# Patient Record
Sex: Female | Born: 1990 | Race: Asian | Hispanic: No | Marital: Married | Smoking: Never smoker
Health system: Southern US, Community
[De-identification: ages and names within clinical notes are randomized; demographics above are authoritative.]

## PROBLEM LIST (undated history)

## (undated) DIAGNOSIS — Z789 Other specified health status: Secondary | ICD-10-CM

---

## 2019-09-08 ENCOUNTER — Other Ambulatory Visit: Payer: Self-pay

## 2019-09-08 ENCOUNTER — Ambulatory Visit (INDEPENDENT_AMBULATORY_CARE_PROVIDER_SITE_OTHER): Payer: Medicaid Other | Admitting: Certified Nurse Midwife

## 2019-09-08 ENCOUNTER — Encounter: Payer: Self-pay | Admitting: Certified Nurse Midwife

## 2019-09-08 VITALS — BP 113/68 | HR 82 | Ht 60.0 in | Wt 133.0 lb

## 2019-09-08 DIAGNOSIS — O34219 Maternal care for unspecified type scar from previous cesarean delivery: Secondary | ICD-10-CM | POA: Diagnosis not present

## 2019-09-08 DIAGNOSIS — Z3A20 20 weeks gestation of pregnancy: Secondary | ICD-10-CM

## 2019-09-08 DIAGNOSIS — Z3480 Encounter for supervision of other normal pregnancy, unspecified trimester: Secondary | ICD-10-CM | POA: Insufficient documentation

## 2019-09-08 DIAGNOSIS — Z862 Personal history of diseases of the blood and blood-forming organs and certain disorders involving the immune mechanism: Secondary | ICD-10-CM | POA: Insufficient documentation

## 2019-09-08 NOTE — Patient Instructions (Signed)
Preparing for Vaginal Birth After Cesarean Delivery Vaginal birth after cesarean delivery (VBAC) is giving birth vaginally after previously delivering a baby through a cesarean section (C-section). You and your health are provider will discuss your options and whether you may be a good candidate for VBAC. What are my options? After a cesarean delivery, your options for future deliveries may include:  Scheduled repeat cesarean delivery. This is done in a hospital with an operating room.  Trial of labor after cesarean (TOLAC). A successful TOLAC results in a vaginal delivery. If it is not successful, you will need to have a cesarean delivery. TOLAC should be attempted in facilities where an emergency cesarean delivery can be performed. It should not be done as a home birth. Talk with your health care provider about the risks and benefits of each option early in your pregnancy. The best option for you will depend on your preferences and your overall health as well as your baby's. What should I know about my past cesarean delivery? It is important to know what type of incision was made in your uterus in a past cesarean delivery. The type of incision can affect the success of your TOLAC. Types of incisions include:  Low transverse. This is a side-to-side cut low on your uterus. The scar on your skin looks like a horizontal line just above your pubic area. This type of cut is the most common and makes you a good candidate for TOLAC.  Low vertical. This is an up-and-down cut low on your uterus. The scar on your skin looks like a vertical line between your pubic area and belly button. This type of cut puts you at higher risk for problems during TOLAC.  High vertical or classical. This is an up-and-down cut high on your uterus. The scar on your skin looks like a vertical line that runs over the top of your belly button. This type of cut has the highest risk for problems and usually means that TOLAC is not an  option. When is VBAC not an option? As you progress through your pregnancy, circumstances may change and you may need to reconsider your options. Your situation may also change even as you begin TOLAC. Your health care provider may not want you to attempt a VBAC if you:  Need to have labor started (induced) because your cervix is not ready for labor.  Have never had a vaginal delivery.  Have had more than two cesarean deliveries.  Are overdue.  Are pregnant with a very large baby.  Have a condition that causes high blood pressure (preeclampsia). Questions to ask your health care provider  Am I a good candidate for TOLAC?  What are my chances of a successful vaginal delivery?  Is my preferred birth location equipped for a TOLAC?  What are my pain management options during a TOLAC? Where to find more information  American Congress of Obstetricians and Gynecologists: www.acog.org  American College of Nurse-Midwives: www.midwife.org Summary  Vaginal birth after cesarean delivery (VBAC) is giving birth vaginally after previously delivering a baby through a cesarean section (C-section).  VBAC may be a safe and appropriate option for you depending on your medical history and other risk factors. Talk with your health care provider about the options available to you, and the risks and benefits of each early in your pregnancy.  TOLAC should be attempted in facilities where emergency cesarean section procedures can be performed. This information is not intended to replace advice given to you by   your health care provider. Make sure you discuss any questions you have with your health care provider. Document Released: 06/19/2011 Document Revised: 01/27/2019 Document Reviewed: 01/10/2017 Elsevier Patient Education  2020 Elsevier Inc.  

## 2019-09-08 NOTE — Progress Notes (Signed)
History:   Ruth Gillespie is a 28 y.o. G3P2002 at [redacted]w[redacted]d by LMP being seen today for her first obstetrical visit with this office. She is a transfer at 20 weeks from [facility].  Her obstetrical history is significant for previous cesarean delivery. Patient does intend to breast feed. Pregnancy history fully reviewed.  Patient reports no complaints. Prenatal records reviewed and scanned under media.      HISTORY: OB History  Gravida Para Term Preterm AB Living  3 2 2  0 0 2  SAB TAB Ectopic Multiple Live Births  0 0 0 0 0    # Outcome Date GA Lbr Len/2nd Weight Sex Delivery Anes PTL Lv  3 Current           2 Term      Vag-Spont     1 Term      CS-LVertical       Patient unsure of last pap smear  - needs PP pap   History reviewed. No pertinent past medical history. Past Surgical History:  Procedure Laterality Date  . CESAREAN SECTION     History reviewed. No pertinent family history. Social History   Tobacco Use  . Smoking status: Never Smoker  . Smokeless tobacco: Never Used  Substance Use Topics  . Alcohol use: Not Currently  . Drug use: Never   Not on File Current Outpatient Medications on File Prior to Visit  Medication Sig Dispense Refill  . ferrous sulfate 325 (65 FE) MG EC tablet Take 325 mg by mouth 3 (three) times daily with meals.    . Prenatal Vit-Fe Fumarate-FA (PRENATAL MULTIVITAMIN) TABS tablet Take 1 tablet by mouth daily at 12 noon.     No current facility-administered medications on file prior to visit.     Review of Systems Pertinent items noted in HPI and remainder of comprehensive ROS otherwise negative. Physical Exam:   Vitals:   09/08/19 1320 09/08/19 1324  BP: 113/68   Pulse: 82   Weight: 133 lb (60.3 kg)   Height:  5' (1.524 m)   Fetal Heart Rate (bpm): 156 System: General: well-developed, well-nourished female in no acute distress   Breasts:  normal appearance, no masses or tenderness bilaterally   Skin: normal coloration and turgor, no rashes   Neurologic: oriented, normal, negative, normal mood   Extremities: normal strength, tone, and muscle mass, ROM of all joints is normal   HEENT PERRLA, extraocular movement intact and sclera clear   Mouth/Teeth mucous membranes moist, pharynx normal without lesions and dental hygiene good   Neck supple and no masses   Cardiovascular: regular rate and rhythm   Respiratory:  no respiratory distress, normal breath sounds   Abdomen: soft, non-tender; bowel sounds normal; no masses,  no organomegaly, fundal height at umbilicus    Assessment:    Pregnancy: 09/10/19 Patient Active Problem List   Diagnosis Date Noted  . Supervision of other normal pregnancy, antepartum 09/08/2019  . History of cesarean delivery, currently pregnant 09/08/2019  . Hx of iron deficiency anemia 09/08/2019     Plan:    1. Supervision of other normal pregnancy, antepartum - patient doing well, no complaints - welcomed to practice and introduced self to patient  - reviewed prenatal records with patient  - Routine prenatal care  - Anticipatory guidance on upcoming appointments  - AFP collected today  - Babyscripts Schedule Optimization - 09/10/2019 MFM OB COMP + 14 WK; Future - AFP only  2. History of cesarean  delivery, currently pregnant - Hx of cesarean delivery d/t breech presentation, successful VBAC with second pregnancy  - Plans VBAC- next appointment with MD to sign consent   3. Hx of iron deficiency anemia - Patient reports hx of IDA in previous pregnancy  - initial Hgb 11.9 on 08/12/19 according to prenatal records  - patient reports taking daily iron supplementation     Continue prenatal vitamins. Genetic Screening discussed, AFP: ordered. Ultrasound discussed; fetal anatomic survey: ordered. Problem list reviewed and updated. The nature of [facility] with multiple MDs and other Advanced Practice Providers was explained  to patient; also emphasized that residents, students are part of our team. Routine obstetric precautions reviewed. Return in about 4 weeks (around 10/06/2019) for ROB- with MD to sign VBAC consent .     Lajean Manes, [place] for [employer], [place]

## 2019-09-10 LAB — AFP, SERUM, OPEN SPINA BIFIDA
AFP MoM: 1.47
AFP Value: 94.1 ng/mL
Gest. Age on Collection Date: 20.3 wk
Maternal Age At EDD: 26.9 a
OSBR Risk 1 IN: 2965
Test Results:: NEGATIVE
Weight: 133 [lb_av]

## 2019-09-23 ENCOUNTER — Ambulatory Visit (HOSPITAL_COMMUNITY)
Admission: RE | Admit: 2019-09-23 | Discharge: 2019-09-23 | Disposition: A | Discharge status: Home / Self Care | Payer: Medicaid Other | Source: Ambulatory Visit | Attending: Obstetrics and Gynecology | Admitting: Obstetrics and Gynecology

## 2019-09-23 ENCOUNTER — Other Ambulatory Visit (HOSPITAL_COMMUNITY): Payer: Self-pay | Admitting: *Deleted

## 2019-09-23 ENCOUNTER — Other Ambulatory Visit: Payer: Self-pay

## 2019-09-23 DIAGNOSIS — Z362 Encounter for other antenatal screening follow-up: Secondary | ICD-10-CM

## 2019-09-23 DIAGNOSIS — Z3480 Encounter for supervision of other normal pregnancy, unspecified trimester: Secondary | ICD-10-CM | POA: Insufficient documentation

## 2019-09-23 DIAGNOSIS — Z363 Encounter for antenatal screening for malformations: Secondary | ICD-10-CM | POA: Diagnosis not present

## 2019-09-23 DIAGNOSIS — O34219 Maternal care for unspecified type scar from previous cesarean delivery: Secondary | ICD-10-CM | POA: Diagnosis not present

## 2019-09-23 DIAGNOSIS — Z3A22 22 weeks gestation of pregnancy: Secondary | ICD-10-CM | POA: Diagnosis not present

## 2019-10-05 ENCOUNTER — Ambulatory Visit (INDEPENDENT_AMBULATORY_CARE_PROVIDER_SITE_OTHER): Payer: Medicaid Other | Admitting: Obstetrics & Gynecology

## 2019-10-05 ENCOUNTER — Other Ambulatory Visit: Payer: Self-pay

## 2019-10-05 VITALS — BP 115/65 | HR 96 | Temp 98.2°F | Wt 139.0 lb

## 2019-10-05 DIAGNOSIS — Z862 Personal history of diseases of the blood and blood-forming organs and certain disorders involving the immune mechanism: Secondary | ICD-10-CM

## 2019-10-05 DIAGNOSIS — Z3A24 24 weeks gestation of pregnancy: Secondary | ICD-10-CM

## 2019-10-05 DIAGNOSIS — O34219 Maternal care for unspecified type scar from previous cesarean delivery: Secondary | ICD-10-CM

## 2019-10-05 DIAGNOSIS — Z3480 Encounter for supervision of other normal pregnancy, unspecified trimester: Secondary | ICD-10-CM

## 2019-10-05 NOTE — Progress Notes (Signed)
   PRENATAL VISIT NOTE  Subjective:  Ruth Gillespie is a 28 y.o. G3P2002 at [redacted]w[redacted]d being seen today for ongoing prenatal care.  She is currently monitored for the following issues for this low-risk pregnancy and has Supervision of other normal pregnancy, antepartum; History of cesarean delivery, currently pregnant; and Hx of iron deficiency anemia on their problem list.  Patient reports pelvic pressure in the morning in gunial region, no pubic symphysis pain.  Contractions: Not present. Vag. Bleeding: None.  Movement: Present. Denies leaking of fluid.   The following portions of the patient's history were reviewed and updated as appropriate: allergies, current medications, past family history, past medical history, past social history, past surgical history and problem list.   Objective:   Vitals:   10/05/19 1342  BP: 115/65  Pulse: 96  Temp: 98.2 F (36.8 C)  Weight: 139 lb (63 kg)    Fetal Status: Fetal Heart Rate (bpm): 145   Movement: Present     General:  Alert, oriented and cooperative. Patient is in no acute distress.  Skin: Skin is warm and dry. No rash noted.   Cardiovascular: Normal heart rate noted  Respiratory: Normal respiratory effort, no problems with respiration noted  Abdomen: Soft, gravid, appropriate for gestational age.  Pain/Pressure: Present     Pelvic: Cervical exam performed      internal os closed, uterus low in pelvis, lax abdominal wall and mobile uterus.  Extremities: Normal range of motion.  Edema: None  Mental Status: Normal mood and affect. Normal behavior. Normal judgment and thought content.   Assessment and Plan:  Pregnancy: G3P2002 at [redacted]w[redacted]d 1. Hx of iron deficiency anemia Continue Iron  2. History of cesarean delivery, currently pregnant VBAC consent signed.  Pelvis proven to 8 pounds 3 ounces  Preterm labor symptoms and general obstetric precautions including but not limited to vaginal bleeding, contractions, leaking of fluid and  fetal movement were reviewed in detail with the patient. Please refer to After Visit Summary for other counseling recommendations.   Return in about 4 weeks (around 11/02/2019) for Routine OB.  Future Appointments  Date Time Provider [place]  10/22/2019  3:00 PM [facility] [place] 5 [facility]    Silas [place], MD

## 2019-10-16 NOTE — L&D Delivery Note (Signed)
Delivery Note At 8:09 AM a viable female was delivered via Vaginal, Spontaneous (Presentation: Right Occiput Anterior).  APGAR: 9, 10; weight  .   Placenta status: Spontaneous;Expressed, Intact.  Cord: 3 vessels with the following complications: None.    Anesthesia: Epidural Episiotomy: None Lacerations: 1st degree Suture Repair: 3.0 vicryl Est. Blood Loss (mL): 300  Mom to postpartum.  Baby to Couplet care / Skin to Skin.  Allie Bossier 01/12/2020, 8:37 AM

## 2019-10-22 ENCOUNTER — Other Ambulatory Visit: Payer: Self-pay

## 2019-10-22 ENCOUNTER — Ambulatory Visit (HOSPITAL_COMMUNITY)
Admission: RE | Admit: 2019-10-22 | Discharge: 2019-10-22 | Disposition: A | Discharge status: Home / Self Care | Payer: Medicaid Other | Source: Ambulatory Visit | Attending: Obstetrics and Gynecology | Admitting: Obstetrics and Gynecology

## 2019-10-22 DIAGNOSIS — Z3A26 26 weeks gestation of pregnancy: Secondary | ICD-10-CM | POA: Diagnosis not present

## 2019-10-22 DIAGNOSIS — O34219 Maternal care for unspecified type scar from previous cesarean delivery: Secondary | ICD-10-CM

## 2019-10-22 DIAGNOSIS — Z362 Encounter for other antenatal screening follow-up: Secondary | ICD-10-CM | POA: Diagnosis present

## 2019-10-22 DIAGNOSIS — O99012 Anemia complicating pregnancy, second trimester: Secondary | ICD-10-CM | POA: Diagnosis not present

## 2019-11-02 ENCOUNTER — Other Ambulatory Visit: Payer: Self-pay

## 2019-11-02 ENCOUNTER — Ambulatory Visit (INDEPENDENT_AMBULATORY_CARE_PROVIDER_SITE_OTHER): Payer: Medicaid Other | Admitting: Obstetrics & Gynecology

## 2019-11-02 VITALS — BP 99/55 | HR 72 | Temp 98.0°F | Wt 139.0 lb

## 2019-11-02 DIAGNOSIS — Z3480 Encounter for supervision of other normal pregnancy, unspecified trimester: Secondary | ICD-10-CM

## 2019-11-02 DIAGNOSIS — Z3483 Encounter for supervision of other normal pregnancy, third trimester: Secondary | ICD-10-CM

## 2019-11-02 DIAGNOSIS — Z23 Encounter for immunization: Secondary | ICD-10-CM

## 2019-11-02 DIAGNOSIS — Z3A28 28 weeks gestation of pregnancy: Secondary | ICD-10-CM

## 2019-11-02 NOTE — Progress Notes (Signed)
   PRENATAL VISIT NOTE  Subjective:  Ruth Gillespie is a 29 y.o. G3P2002 at [redacted]w[redacted]d being seen today for ongoing prenatal care.  She is currently monitored for the following issues for this low-risk pregnancy and has Supervision of other normal pregnancy, antepartum; History of cesarean delivery, currently pregnant; and Hx of iron deficiency anemia on their problem list.  Patient reports no complaints.  Contractions: Not present. Vag. Bleeding: None.  Movement: Present. Denies leaking of fluid.   The following portions of the patient's history were reviewed and updated as appropriate: allergies, current medications, past family history, past medical history, past social history, past surgical history and problem list.   Objective:   Vitals:   11/02/19 0803  BP: (!) 99/55  Pulse: 72  Temp: 98 F (36.7 C)  Weight: 139 lb (63 kg)    Fetal Status: Fetal Heart Rate (bpm): 143   Movement: Present     General:  Alert, oriented and cooperative. Patient is in no acute distress.  Skin: Skin is warm and dry. No rash noted.   Cardiovascular: Normal heart rate noted  Respiratory: Normal respiratory effort, no problems with respiration noted  Abdomen: Soft, gravid, appropriate for gestational age.  Pain/Pressure: Absent     Pelvic: Cervical exam deferred        Extremities: Normal range of motion.  Edema: None  Mental Status: Normal mood and affect. Normal behavior. Normal judgment and thought content.   Assessment and Plan:  Pregnancy: G3P2002 at [redacted]w[redacted]d 1. Supervision of other normal pregnancy, antepartum - 2 Hour GTT - CBC - HIV antibody (with reflex) - RPR - Tdap vaccine greater than or equal to 7yo IM  Preterm labor symptoms and general obstetric precautions including but not limited to vaginal bleeding, contractions, leaking of fluid and fetal movement were reviewed in detail with the patient. Please refer to After Visit Summary for other counseling recommendations.   No  follow-ups on file.  Future Appointments  Date Time Provider Department Center  11/30/2019  1:15 PM Conan Bowens, MD [facility]    Elsie Lincoln, MD

## 2019-11-03 LAB — 2HR GTT W 1 HR, CARPENTER, 75 G
Glucose, 1 Hr, Gest: 121 mg/dL (ref 65–179)
Glucose, 2 Hr, Gest: 69 mg/dL (ref 65–152)
Glucose, Fasting, Gest: 71 mg/dL (ref 65–91)

## 2019-11-03 LAB — CBC
HCT: 32.1 % — ABNORMAL LOW (ref 35.0–45.0)
Hemoglobin: 10.9 g/dL — ABNORMAL LOW (ref 11.7–15.5)
MCH: 29.9 pg (ref 27.0–33.0)
MCHC: 34 g/dL (ref 32.0–36.0)
MCV: 87.9 fL (ref 80.0–100.0)
MPV: 9.7 fL (ref 7.5–12.5)
Platelets: 249 10*3/uL (ref 140–400)
RBC: 3.65 10*6/uL — ABNORMAL LOW (ref 3.80–5.10)
RDW: 12 % (ref 11.0–15.0)
WBC: 9.9 10*3/uL (ref 3.8–10.8)

## 2019-11-03 LAB — HIV ANTIBODY (ROUTINE TESTING W REFLEX): HIV 1&2 Ab, 4th Generation: NONREACTIVE

## 2019-11-03 LAB — RPR: RPR Ser Ql: NONREACTIVE

## 2019-11-30 ENCOUNTER — Telehealth (INDEPENDENT_AMBULATORY_CARE_PROVIDER_SITE_OTHER): Payer: Medicaid Other | Admitting: Obstetrics & Gynecology

## 2019-11-30 ENCOUNTER — Encounter: Payer: Self-pay | Admitting: Obstetrics & Gynecology

## 2019-11-30 DIAGNOSIS — Z862 Personal history of diseases of the blood and blood-forming organs and certain disorders involving the immune mechanism: Secondary | ICD-10-CM

## 2019-11-30 DIAGNOSIS — Z3A32 32 weeks gestation of pregnancy: Secondary | ICD-10-CM

## 2019-11-30 DIAGNOSIS — O34219 Maternal care for unspecified type scar from previous cesarean delivery: Secondary | ICD-10-CM

## 2019-11-30 NOTE — Progress Notes (Signed)
Difficulty sleeping

## 2019-11-30 NOTE — Progress Notes (Signed)
Patient ID: Ruth Gillespie, female   DOB: 1991/04/17, 29 y.o.   MRN: [id number]  I connected with@ on 11/30/19 at  1:15 PM EST by: my chart and verified that I am speaking with the correct person using two identifiers.  Patient is located at home and provider is located at [place] office.     The purpose of this virtual visit is to provide medical care while limiting exposure to the novel coronavirus. I discussed the limitations, risks, security and privacy concerns of performing an evaluation and management service by mychart and the availability of in person appointments. I also discussed with the patient that there may be a patient responsible charge related to this service. By engaging in this virtual visit, you consent to the provision of healthcare.  Additionally, you authorize for your insurance to be billed for the services provided during this visit.  The patient expressed understanding and agreed to proceed.  The following staff members participated in the virtual visit:  Mariel Aloe, RN    PRENATAL VISIT NOTE  Subjective:  Ruth Gillespie is a 29 y.o. G3P2002 at [redacted]w[redacted]d  for phone visit for ongoing prenatal care.  She is currently monitored for the following issues for this low-risk pregnancy and has Supervision of other normal pregnancy, antepartum; History of cesarean delivery, currently pregnant; and Hx of iron deficiency anemia on their problem list.  Patient reports back pain and leg numbness when sleeping on side.  Contractions: Irritability. Vag. Bleeding: None.  Movement: Present. Denies leaking of fluid.   The following portions of the patient's history were reviewed and updated as appropriate: allergies, current medications, past family history, past medical history, past social history, past surgical history and problem list.   Objective:   Vitals:   11/30/19 1302  BP: (!) 103/57  Pulse: 86  Weight: 141 lb (64 kg)   Self-Obtained  Fetal Status:      Movement: Present     Assessment and Plan:  Pregnancy: G3P2002 at [redacted]w[redacted]d 1. History of cesarean delivery, currently pregnant Plans for VBAC Considering nexplanon or IUD Body pillow for between legs or to tilt at 15 degree angle vs laying completely on side.    2. Hx of iron deficiency anemia Continue iron and check CBC at 36 weeks  Preterm labor symptoms and general obstetric precautions including but not limited to vaginal bleeding, contractions, leaking of fluid and fetal movement were reviewed in detail with the patient.  RTC 4 weeks in person  Time spent on virtual visit: 10 minutes  Elsie Lincoln, MD

## 2019-11-30 NOTE — Patient Instructions (Addendum)
Intrauterine Device Insertion An intrauterine device (IUD) is a medical device that gets inserted into the uterus to prevent pregnancy. It is a small, T-shaped device that has one or two nylon strings hanging down from it. The strings hang out of the lower part of the uterus (cervix) to allow for future IUD removal. There are two types of IUDs available:  Copper IUD. This type of IUD has copper wire wrapped around it. Copper makes the uterus and fallopian tubes produce a fluid that kills sperm. A copper IUD may last up to 10 years.  Hormone IUD. This type of IUD is made of plastic and contains the hormone progestin (synthetic progesterone). The hormone thickens mucus in the cervix and prevents sperm from entering the uterus. It also thins the uterine lining to prevent implantation of a fertilized egg. The hormone can weaken or kill the sperm that get into the uterus. A hormone IUD may last 3-5 years. Tell a health care provider about:  Any allergies you have.  All medicines you are taking, including vitamins, herbs, eye drops, creams, and over-the-counter medicines.  Any problems you or family members have had with anesthetic medicines.  Any blood disorders you have.  Any surgeries you have had.  Any medical conditions you have, including any STIs (sexually transmitted infections) you may have.  Whether you are pregnant or may be pregnant. What are the risks? Generally, this is a safe procedure. However, problems may occur, including:  Infection.  Bleeding.  Allergic reactions to medicines.  Accidental puncture (perforation) of the uterus, or damage to other structures or organs.  Accidental placement of the IUD either in the muscle layer of the uterus (myometrium) or outside the uterus.  The IUD falling out of the uterus (expulsion). This is more common among women who have recently had a child.  Pregnancy that happens in the fallopian tube (ectopic pregnancy).  Infection of  the uterus and fallopian tubes (pelvic inflammatory disease). What happens before the procedure?  Schedule the IUD insertion for when you will have your menstrual period or right after, to make sure you are not pregnant. Placement of the IUD is better tolerated shortly after a menstrual cycle.  Follow instructions from your health care provider about eating or drinking restrictions.  Ask your health care provider about changing or stopping your regular medicines. This is especially important if you are taking diabetes medicines or blood thinners.  You may get a pain reliever to take before the procedure.  You may have tests for: ? Pregnancy. A pregnancy test involves having a urine sample taken. ? STIs. Placing an IUD in someone who has an STI can make the infection worse. ? Cervical cancer. You may have a Pap test to check for this type of cancer. This means collecting cells from your cervix to be examined under a microscope.  You may have a physical exam to determine the size and position of your uterus. The procedure may vary among health care providers and hospitals. What happens during the procedure?  A tool (speculum) will be placed in your vagina and widened so that your health care provider can see your cervix.  Medicine may be applied to your cervix to help lower your risk of infection (antiseptic medicine).  You may be given an anesthetic medicine to numb each side of your cervix (intracervical block or paracervical block). This medicine is usually given by an injection into the cervix.  A tool (uterine sound) will be inserted into your   uterus to determine the length of your uterus and the direction that your uterus may be tilted.  A slim instrument or tube (IUD inserter) that holds the IUD will be inserted into your vagina, through your cervical canal, and into your uterus.  The IUD will be placed in the uterus, and the IUD inserter will be removed.  The strings that are  attached to the IUD will be trimmed so that they lie just below the cervix. The procedure may vary among health care providers and hospitals. What happens after the procedure?  You may have bleeding after the procedure. This is normal. It varies from light bleeding (spotting) for a few days to menstrual-like bleeding.  You may have cramping and pain.  You may feel dizzy or light-headed.  You may have lower back pain. Summary  An intrauterine device (IUD) is a small, T-shaped device that has one or two nylon strings hanging down from it.  Two types of IUDs are available. You may have a copper IUD or a hormone IUD.  Schedule the IUD insertion for when you will have your menstrual period or right after, to make sure you are not pregnant. Placement of the IUD is better tolerated shortly after a menstrual cycle.  You may have bleeding after the procedure. This is normal. It varies from light spotting for a few days to menstrual-like bleeding. This information is not intended to replace advice given to you by your health care provider. Make sure you discuss any questions you have with your health care provider. Document Revised: 09/13/2017 Document Reviewed: 08/22/2016 Elsevier Patient Education  2020 [place] implant What is this medicine? ETONOGESTREL (et oh noe JES trel) is a contraceptive (birth control) device. It is used to prevent pregnancy. It can be used for up to 3 years. This medicine may be used for other purposes; ask your health care provider or pharmacist if you have questions. COMMON BRAND NAME(S): Implanon, Nexplanon What should I tell my health care provider before I take this medicine? They need to know if you have any of these conditions:  abnormal vaginal bleeding  blood vessel disease or blood clots  breast, cervical, endometrial, ovarian, liver, or uterine cancer  diabetes  gallbladder disease  heart disease or recent heart attack  high  blood pressure  high cholesterol or triglycerides  kidney disease  liver disease  migraine headaches  seizures  stroke  tobacco smoker  an unusual or allergic reaction to etonogestrel, anesthetics or antiseptics, other medicines, foods, dyes, or preservatives  pregnant or trying to get pregnant  breast-feeding How should I use this medicine? This device is inserted just under the skin on the inner side of your upper arm by a health care professional. Talk to your pediatrician regarding the use of this medicine in children. Special care may be needed. Overdosage: If you think you have taken too much of this medicine contact a poison control center or emergency room at once. NOTE: This medicine is only for you. Do not share this medicine with others. What if I miss a dose? This does not apply. What may interact with this medicine? Do not take this medicine with any of the following medications:  amprenavir  fosamprenavir This medicine may also interact with the following medications:  acitretin  aprepitant  armodafinil  bexarotene  bosentan  carbamazepine  certain medicines for fungal infections like fluconazole, ketoconazole, itraconazole and voriconazole  certain medicines to treat hepatitis, HIV or AIDS  cyclosporine  felbamate  griseofulvin  lamotrigine  modafinil  oxcarbazepine  phenobarbital  phenytoin  primidone  rifabutin  rifampin  rifapentine  St. John's wort  topiramate This list may not describe all possible interactions. Give your health care provider a list of all the medicines, herbs, non-prescription drugs, or dietary supplements you use. Also tell them if you smoke, drink alcohol, or use illegal drugs. Some items may interact with your medicine. What should I watch for while using this medicine? This product does not protect you against HIV infection (AIDS) or other sexually transmitted diseases. You should be able to  feel the implant by pressing your fingertips over the skin where it was inserted. Contact your doctor if you cannot feel the implant, and use a non-hormonal birth control method (such as condoms) until your doctor confirms that the implant is in place. Contact your doctor if you think that the implant may have broken or become bent while in your arm. You will receive a user card from your health care provider after the implant is inserted. The card is a record of the location of the implant in your upper arm and when it should be removed. Keep this card with your health records. What side effects may I notice from receiving this medicine? Side effects that you should report to your doctor or health care professional as soon as possible:  allergic reactions like skin rash, itching or hives, swelling of the face, lips, or tongue  breast lumps, breast tissue changes, or discharge  breathing problems  changes in emotions or moods  coughing up blood  if you feel that the implant may have broken or bent while in your arm  high blood pressure  pain, irritation, swelling, or bruising at the insertion site  scar at site of insertion  signs of infection at the insertion site such as fever, and skin redness, pain or discharge  signs and symptoms of a blood clot such as breathing problems; changes in vision; chest pain; severe, sudden headache; pain, swelling, warmth in the leg; trouble speaking; sudden numbness or weakness of the face, arm or leg  signs and symptoms of liver injury like dark yellow or brown urine; general ill feeling or flu-like symptoms; light-colored stools; loss of appetite; nausea; right upper belly pain; unusually weak or tired; yellowing of the eyes or skin  unusual vaginal bleeding, discharge Side effects that usually do not require medical attention (report to your doctor or health care professional if they continue or are bothersome):  acne  breast pain or tenderness   headache  irregular menstrual bleeding  nausea This list may not describe all possible side effects. Call your doctor for medical advice about side effects. You may report side effects to FDA at 1-800-FDA-1088. Where should I keep my medicine? This drug is given in a hospital or clinic and will not be stored at home. NOTE: This sheet is a summary. It may not cover all possible information. If you have questions about this medicine, talk to your doctor, pharmacist, or health care provider.  2020 Elsevier/Gold Standard (2019-07-14 11:33:04)

## 2019-12-31 ENCOUNTER — Other Ambulatory Visit (HOSPITAL_COMMUNITY)
Admission: RE | Admit: 2019-12-31 | Discharge: 2019-12-31 | Disposition: A | Discharge status: Home / Self Care | Payer: Medicaid Other | Source: Ambulatory Visit | Attending: Obstetrics & Gynecology | Admitting: Obstetrics & Gynecology

## 2019-12-31 ENCOUNTER — Encounter: Payer: Self-pay | Admitting: Obstetrics & Gynecology

## 2019-12-31 ENCOUNTER — Other Ambulatory Visit: Payer: Self-pay

## 2019-12-31 ENCOUNTER — Ambulatory Visit (INDEPENDENT_AMBULATORY_CARE_PROVIDER_SITE_OTHER): Payer: Medicaid Other | Admitting: Obstetrics & Gynecology

## 2019-12-31 VITALS — BP 118/69 | HR 93 | Temp 97.9°F | Wt 148.0 lb

## 2019-12-31 DIAGNOSIS — Z3483 Encounter for supervision of other normal pregnancy, third trimester: Secondary | ICD-10-CM

## 2019-12-31 DIAGNOSIS — Z862 Personal history of diseases of the blood and blood-forming organs and certain disorders involving the immune mechanism: Secondary | ICD-10-CM | POA: Diagnosis not present

## 2019-12-31 DIAGNOSIS — O26843 Uterine size-date discrepancy, third trimester: Secondary | ICD-10-CM

## 2019-12-31 DIAGNOSIS — Z3480 Encounter for supervision of other normal pregnancy, unspecified trimester: Secondary | ICD-10-CM

## 2019-12-31 DIAGNOSIS — O34219 Maternal care for unspecified type scar from previous cesarean delivery: Secondary | ICD-10-CM

## 2019-12-31 DIAGNOSIS — Z3A36 36 weeks gestation of pregnancy: Secondary | ICD-10-CM

## 2019-12-31 LAB — OB RESULTS CONSOLE GBS: GBS: POSITIVE

## 2019-12-31 NOTE — Progress Notes (Signed)
   PRENATAL VISIT NOTE  Subjective:  Ruth Gillespie is a 29 y.o. G3P2002 at [redacted]w[redacted]d being seen today for ongoing prenatal care.  She is currently monitored for the following issues for this low-risk pregnancy and has Supervision of other normal pregnancy, antepartum; History of cesarean delivery, currently pregnant; Hx of iron deficiency anemia; and History of successful VBAC on their problem list.  Patient reports occasional contractions.  Contractions: Irritability. Vag. Bleeding: None.  Movement: Present. Denies leaking of fluid.   The following portions of the patient's history were reviewed and updated as appropriate: allergies, current medications, past family history, past medical history, past social history, past surgical history and problem list.   Objective:   Vitals:   12/31/19 1432  BP: 118/69  Pulse: 93  Temp: 97.9 F (36.6 C)  Weight: 148 lb (67.1 kg)    Fetal Status:     Movement: Present  Presentation: Vertex  General:  Alert, oriented and cooperative. Patient is in no acute distress.  Skin: Skin is warm and dry. No rash noted.   Cardiovascular: Normal heart rate noted  Respiratory: Normal respiratory effort, no problems with respiration noted  Abdomen: Soft, gravid, appropriate for gestational age.  Pain/Pressure: Absent     Pelvic: Cervical exam performed in the presence of a chaperone Dilation: Fingertip Effacement (%): 50 Station: -3  Extremities: Normal range of motion.  Edema: Trace  Mental Status: Normal mood and affect. Normal behavior. Normal judgment and thought content.   Assessment and Plan:  Pregnancy: G3P2002 at [redacted]w[redacted]d 1. Hx of iron deficiency anemia - CBC will check in place CBC Latest Ref Rng & Units 11/02/2019  WBC 3.8 - 10.8 Thousand/uL 9.9  Hemoglobin 11.7 - 15.5 g/dL 10.9(L)  Hematocrit 35.0 - 45.0 % 32.1(L)  Platelets 140 - 400 Thousand/uL 249   2. History of cesarean delivery, currently pregnant 3. History of successful VBAC  Desires TOLAC, consent signed on 10/05/2019  4. Concern that size of fetus is inconsistent with dates in third trimester Patient is concerned this baby is bigger than her previous two babies, had 8lb 15ozx infant via C/S and 8 lb infant via VBAC.  Growth scan ordered.  If baby is on the larger size, she is interested in IOL at 39 weeks.  - [place] MFM OB FOLLOW UP; Future  5. Supervision of other normal pregnancy, antepartum Pelvic cultures done.   - Culture, beta strep (group b only) - Cervicovaginal ancillary only( [facility]) Considering BTS, Medicaid papers were signed. Also considering IPP IUD. Preterm labor symptoms and general obstetric precautions including but not limited to vaginal bleeding, contractions, leaking of fluid and fetal movement were reviewed in detail with the patient. Please refer to After Visit Summary for other counseling recommendations.   Return in about 1 week (around 01/07/2020) for OFFICE OB Visit.  No future appointments.  Jaynie Collins, MD

## 2019-12-31 NOTE — Patient Instructions (Signed)
Return to office for any scheduled appointments. Call the office or go to the MAU at [facility] [place] if:  You begin to have strong, frequent contractions  Your water breaks.  Sometimes it is a big gush of fluid, sometimes it is just a trickle that keeps getting your panties wet or running down your legs  You have vaginal bleeding.  It is normal to have a small amount of spotting if your cervix was checked.   You do not feel your baby moving like normal.  If you do not, get something to eat and drink and lay down and focus on feeling your baby move.   If your baby is still not moving like normal, you should call the office or go to MAU.  Any other obstetric concerns.   

## 2020-01-01 ENCOUNTER — Ambulatory Visit (HOSPITAL_COMMUNITY)
Admission: RE | Admit: 2020-01-01 | Discharge: 2020-01-01 | Disposition: A | Discharge status: Home / Self Care | Payer: Medicaid Other | Source: Ambulatory Visit | Attending: Obstetrics and Gynecology | Admitting: Obstetrics and Gynecology

## 2020-01-01 DIAGNOSIS — O34219 Maternal care for unspecified type scar from previous cesarean delivery: Secondary | ICD-10-CM | POA: Diagnosis not present

## 2020-01-01 DIAGNOSIS — O99013 Anemia complicating pregnancy, third trimester: Secondary | ICD-10-CM | POA: Diagnosis not present

## 2020-01-01 DIAGNOSIS — O26843 Uterine size-date discrepancy, third trimester: Secondary | ICD-10-CM | POA: Diagnosis present

## 2020-01-01 DIAGNOSIS — Z362 Encounter for other antenatal screening follow-up: Secondary | ICD-10-CM

## 2020-01-01 DIAGNOSIS — Z3A36 36 weeks gestation of pregnancy: Secondary | ICD-10-CM

## 2020-01-01 DIAGNOSIS — D649 Anemia, unspecified: Secondary | ICD-10-CM

## 2020-01-01 LAB — CERVICOVAGINAL ANCILLARY ONLY
Chlamydia: NEGATIVE
Comment: NEGATIVE
Comment: NORMAL
Neisseria Gonorrhea: NEGATIVE

## 2020-01-02 ENCOUNTER — Encounter: Payer: Self-pay | Admitting: Obstetrics & Gynecology

## 2020-01-02 DIAGNOSIS — O99820 Streptococcus B carrier state complicating pregnancy: Secondary | ICD-10-CM | POA: Insufficient documentation

## 2020-01-02 LAB — CBC
HCT: 35.4 % (ref 35.0–45.0)
Hemoglobin: 12.5 g/dL (ref 11.7–15.5)
MCH: 30 pg (ref 27.0–33.0)
MCHC: 35.3 g/dL (ref 32.0–36.0)
MCV: 85.1 fL (ref 80.0–100.0)
MPV: 10.3 fL (ref 7.5–12.5)
Platelets: 241 10*3/uL (ref 140–400)
RBC: 4.16 10*6/uL (ref 3.80–5.10)
RDW: 13.4 % (ref 11.0–15.0)
WBC: 10.4 10*3/uL (ref 3.8–10.8)

## 2020-01-02 LAB — CULTURE, BETA STREP (GROUP B ONLY)
MICRO NUMBER:: 10266651
SPECIMEN QUALITY:: ADEQUATE

## 2020-01-11 ENCOUNTER — Ambulatory Visit (INDEPENDENT_AMBULATORY_CARE_PROVIDER_SITE_OTHER): Payer: Medicaid Other | Admitting: Obstetrics & Gynecology

## 2020-01-11 ENCOUNTER — Encounter (HOSPITAL_COMMUNITY): Payer: Self-pay | Admitting: Obstetrics & Gynecology

## 2020-01-11 ENCOUNTER — Inpatient Hospital Stay (HOSPITAL_COMMUNITY)
Admission: AD | Admit: 2020-01-11 | Discharge: 2020-01-13 | DRG: 807 | Disposition: A | Discharge status: Home / Self Care | Payer: Medicaid Other | Attending: Obstetrics & Gynecology | Admitting: Obstetrics & Gynecology

## 2020-01-11 ENCOUNTER — Other Ambulatory Visit: Payer: Self-pay

## 2020-01-11 VITALS — BP 117/73 | HR 93 | Temp 98.5°F | Wt 149.0 lb

## 2020-01-11 DIAGNOSIS — Z862 Personal history of diseases of the blood and blood-forming organs and certain disorders involving the immune mechanism: Secondary | ICD-10-CM

## 2020-01-11 DIAGNOSIS — O99824 Streptococcus B carrier state complicating childbirth: Secondary | ICD-10-CM | POA: Diagnosis present

## 2020-01-11 DIAGNOSIS — Z20822 Contact with and (suspected) exposure to covid-19: Secondary | ICD-10-CM | POA: Diagnosis present

## 2020-01-11 DIAGNOSIS — O34211 Maternal care for low transverse scar from previous cesarean delivery: Secondary | ICD-10-CM | POA: Diagnosis not present

## 2020-01-11 DIAGNOSIS — O34219 Maternal care for unspecified type scar from previous cesarean delivery: Secondary | ICD-10-CM

## 2020-01-11 DIAGNOSIS — N858 Other specified noninflammatory disorders of uterus: Secondary | ICD-10-CM | POA: Diagnosis not present

## 2020-01-11 DIAGNOSIS — Z3A38 38 weeks gestation of pregnancy: Secondary | ICD-10-CM | POA: Diagnosis not present

## 2020-01-11 DIAGNOSIS — Z3480 Encounter for supervision of other normal pregnancy, unspecified trimester: Secondary | ICD-10-CM

## 2020-01-11 DIAGNOSIS — O99820 Streptococcus B carrier state complicating pregnancy: Secondary | ICD-10-CM

## 2020-01-11 DIAGNOSIS — O26893 Other specified pregnancy related conditions, third trimester: Secondary | ICD-10-CM | POA: Diagnosis present

## 2020-01-11 HISTORY — DX: Other specified health status: Z78.9

## 2020-01-11 LAB — CBC
HCT: 37.1 % (ref 36.0–46.0)
Hemoglobin: 12.4 g/dL (ref 12.0–15.0)
MCH: 29.3 pg (ref 26.0–34.0)
MCHC: 33.4 g/dL (ref 30.0–36.0)
MCV: 87.7 fL (ref 80.0–100.0)
Platelets: 254 10*3/uL (ref 150–400)
RBC: 4.23 MIL/uL (ref 3.87–5.11)
RDW: 13.6 % (ref 11.5–15.5)
WBC: 14.2 10*3/uL — ABNORMAL HIGH (ref 4.0–10.5)
nRBC: 0 % (ref 0.0–0.2)

## 2020-01-11 MED ORDER — SODIUM CHLORIDE 0.9 % IV SOLN
2.0000 g | Freq: Once | INTRAVENOUS | Status: AC
  Administered 2020-01-11: 2 g via INTRAVENOUS
  Filled 2020-01-11: qty 2000

## 2020-01-11 MED ORDER — LACTATED RINGERS IV SOLN: INTRAVENOUS | Status: DC

## 2020-01-11 NOTE — Progress Notes (Signed)
   PRENATAL VISIT NOTE  Subjective:  Ruth Gillespie is a 29 y.o. G3P2002 at [redacted]w[redacted]d being seen today for ongoing prenatal care.  She is currently monitored for the following issues for this low-risk pregnancy and has Supervision of other normal pregnancy, antepartum; History of cesarean delivery, currently pregnant; Hx of iron deficiency anemia; History of successful VBAC; and Group B Streptococcus carrier, +RV culture, currently pregnant on their problem list.  Patient reports no complaints.  Contractions: Irregular. Vag. Bleeding: None.  Movement: Present. Denies leaking of fluid.   The following portions of the patient's history were reviewed and updated as appropriate: allergies, current medications, past family history, past medical history, past social history, past surgical history and problem list.   Objective:   Vitals:   01/11/20 1443  BP: 117/73  Pulse: 93  Temp: 98.5 F (36.9 C)  Weight: 149 lb (67.6 kg)    Fetal Status:     Movement: Present     General:  Alert, oriented and cooperative. Patient is in no acute distress.  Skin: Skin is warm and dry. No rash noted.   Cardiovascular: Normal heart rate noted  Respiratory: Normal respiratory effort, no problems with respiration noted  Abdomen: Soft, gravid, appropriate for gestational age.  Pain/Pressure: Present     Pelvic: Cervical exam performed in the presence of a chaperone        Extremities: Normal range of motion.  Edema: Trace  Mental Status: Normal mood and affect. Normal behavior. Normal judgment and thought content.   Assessment and Plan:  Pregnancy: G3P2002 at [redacted]w[redacted]d 1. History of successful VBAC - plans for another TOLAC  2. Supervision of other normal pregnancy, antepartum   3. Group B Streptococcus carrier, +RV culture, currently pregnant - treat in labor  Term labor symptoms and general obstetric precautions including but not limited to vaginal bleeding, contractions, leaking of fluid and  fetal movement were reviewed in detail with the patient. Please refer to After Visit Summary for other counseling recommendations.   No follow-ups on file.  Future Appointments  Date Time Provider Department Center  01/11/2020  3:00 PM Allie Bossier, MD [facility]    Allie Bossier, MD

## 2020-01-11 NOTE — Progress Notes (Signed)
Dr, Marice Potter called and requested pt be admited

## 2020-01-11 NOTE — MAU Note (Signed)
Pt reports contractions that started around 5pm, worsened around 8pm. Now every 4-5 minutes and more intense. Reports she was 5cm in the office today. Denies LOF. Reports some bloody show earlier. Reports good fetal movement.

## 2020-01-12 ENCOUNTER — Inpatient Hospital Stay (HOSPITAL_COMMUNITY): Payer: Medicaid Other | Admitting: Anesthesiology

## 2020-01-12 ENCOUNTER — Encounter (HOSPITAL_COMMUNITY): Payer: Self-pay | Admitting: Obstetrics & Gynecology

## 2020-01-12 ENCOUNTER — Encounter: Payer: Medicaid Other | Admitting: Certified Nurse Midwife

## 2020-01-12 DIAGNOSIS — O34211 Maternal care for low transverse scar from previous cesarean delivery: Secondary | ICD-10-CM

## 2020-01-12 DIAGNOSIS — O99824 Streptococcus B carrier state complicating childbirth: Secondary | ICD-10-CM

## 2020-01-12 DIAGNOSIS — Z3A38 38 weeks gestation of pregnancy: Secondary | ICD-10-CM

## 2020-01-12 DIAGNOSIS — N858 Other specified noninflammatory disorders of uterus: Secondary | ICD-10-CM

## 2020-01-12 LAB — TYPE AND SCREEN
ABO/RH(D): B POS
Antibody Screen: NEGATIVE

## 2020-01-12 LAB — RPR: RPR Ser Ql: NONREACTIVE

## 2020-01-12 LAB — ABO/RH: ABO/RH(D): B POS

## 2020-01-12 LAB — RESPIRATORY PANEL BY RT PCR (FLU A&B, COVID)
Influenza A by PCR: NEGATIVE
Influenza B by PCR: NEGATIVE
SARS Coronavirus 2 by RT PCR: NEGATIVE

## 2020-01-12 MED ORDER — ACETAMINOPHEN 325 MG PO TABS: 650.0000 mg | ORAL_TABLET | ORAL | Status: DC | PRN

## 2020-01-12 MED ORDER — SODIUM CHLORIDE (PF) 0.9 % IJ SOLN
INTRAMUSCULAR | Status: DC | PRN
  Administered 2020-01-12: 12 mL/h via EPIDURAL

## 2020-01-12 MED ORDER — PENICILLIN G POT IN DEXTROSE 60000 UNIT/ML IV SOLN
3.0000 10*6.[IU] | INTRAVENOUS | Status: DC
  Filled 2020-01-12: qty 50

## 2020-01-12 MED ORDER — PHENYLEPHRINE 40 MCG/ML (10ML) SYRINGE FOR IV PUSH (FOR BLOOD PRESSURE SUPPORT): 80.0000 ug | PREFILLED_SYRINGE | INTRAVENOUS | Status: DC | PRN

## 2020-01-12 MED ORDER — FENTANYL-BUPIVACAINE-NACL 0.5-0.125-0.9 MG/250ML-% EP SOLN: 12.0000 mL/h | EPIDURAL | Status: DC | PRN

## 2020-01-12 MED ORDER — ACETAMINOPHEN 325 MG PO TABS
650.0000 mg | ORAL_TABLET | ORAL | Status: DC | PRN
  Administered 2020-01-12 – 2020-01-13 (×2): 650 mg via ORAL
  Filled 2020-01-12 (×2): qty 2

## 2020-01-12 MED ORDER — LACTATED RINGERS IV SOLN: 500.0000 mL | INTRAVENOUS | Status: DC | PRN

## 2020-01-12 MED ORDER — SIMETHICONE 80 MG PO CHEW: 80.0000 mg | CHEWABLE_TABLET | ORAL | Status: DC | PRN

## 2020-01-12 MED ORDER — LACTATED RINGERS IV SOLN: 500.0000 mL | Freq: Once | INTRAVENOUS | Status: DC

## 2020-01-12 MED ORDER — SODIUM CHLORIDE 0.9% FLUSH: 3.0000 mL | Freq: Two times a day (BID) | INTRAVENOUS | Status: DC

## 2020-01-12 MED ORDER — ONDANSETRON HCL 4 MG/2ML IJ SOLN: 4.0000 mg | INTRAMUSCULAR | Status: DC | PRN

## 2020-01-12 MED ORDER — EPHEDRINE 5 MG/ML INJ: 10.0000 mg | INTRAVENOUS | Status: DC | PRN

## 2020-01-12 MED ORDER — BENZOCAINE-MENTHOL 20-0.5 % EX AERO
1.0000 "application " | INHALATION_SPRAY | CUTANEOUS | Status: DC | PRN
  Administered 2020-01-12: 1 via TOPICAL
  Filled 2020-01-12: qty 56

## 2020-01-12 MED ORDER — LIDOCAINE-EPINEPHRINE (PF) 2 %-1:200000 IJ SOLN
INTRAMUSCULAR | Status: DC | PRN
  Administered 2020-01-12: 2 mL via EPIDURAL
  Administered 2020-01-12: 3 mL via EPIDURAL

## 2020-01-12 MED ORDER — DIPHENHYDRAMINE HCL 50 MG/ML IJ SOLN: 12.5000 mg | INTRAMUSCULAR | Status: DC | PRN

## 2020-01-12 MED ORDER — OXYTOCIN BOLUS FROM INFUSION
500.0000 mL | Freq: Once | INTRAVENOUS | Status: AC
  Administered 2020-01-12: 500 mL via INTRAVENOUS

## 2020-01-12 MED ORDER — SOD CITRATE-CITRIC ACID 500-334 MG/5ML PO SOLN
30.0000 mL | ORAL | Status: DC | PRN
  Administered 2020-01-12: 30 mL via ORAL
  Filled 2020-01-12: qty 30

## 2020-01-12 MED ORDER — ONDANSETRON HCL 4 MG PO TABS: 4.0000 mg | ORAL_TABLET | ORAL | Status: DC | PRN

## 2020-01-12 MED ORDER — OXYCODONE-ACETAMINOPHEN 5-325 MG PO TABS: 2.0000 | ORAL_TABLET | ORAL | Status: DC | PRN

## 2020-01-12 MED ORDER — ZOLPIDEM TARTRATE 5 MG PO TABS: 5.0000 mg | ORAL_TABLET | Freq: Every evening | ORAL | Status: DC | PRN

## 2020-01-12 MED ORDER — MEASLES, MUMPS & RUBELLA VAC IJ SOLR: 0.5000 mL | Freq: Once | INTRAMUSCULAR | Status: DC

## 2020-01-12 MED ORDER — FENTANYL-BUPIVACAINE-NACL 0.5-0.125-0.9 MG/250ML-% EP SOLN
EPIDURAL | Status: AC
  Filled 2020-01-12: qty 250

## 2020-01-12 MED ORDER — TETANUS-DIPHTH-ACELL PERTUSSIS 5-2.5-18.5 LF-MCG/0.5 IM SUSP: 0.5000 mL | Freq: Once | INTRAMUSCULAR | Status: DC

## 2020-01-12 MED ORDER — SODIUM CHLORIDE 0.9% FLUSH: 3.0000 mL | INTRAVENOUS | Status: DC | PRN

## 2020-01-12 MED ORDER — IBUPROFEN 600 MG PO TABS
600.0000 mg | ORAL_TABLET | Freq: Four times a day (QID) | ORAL | Status: DC
  Administered 2020-01-12 – 2020-01-13 (×4): 600 mg via ORAL
  Filled 2020-01-12 (×5): qty 1

## 2020-01-12 MED ORDER — OXYTOCIN 40 UNITS IN NORMAL SALINE INFUSION - SIMPLE MED
1.0000 m[IU]/min | INTRAVENOUS | Status: DC
  Administered 2020-01-12: 1.333 m[IU]/min via INTRAVENOUS
  Filled 2020-01-12: qty 1000

## 2020-01-12 MED ORDER — SENNOSIDES-DOCUSATE SODIUM 8.6-50 MG PO TABS
2.0000 | ORAL_TABLET | ORAL | Status: DC
  Administered 2020-01-13: 2 via ORAL
  Filled 2020-01-12: qty 2

## 2020-01-12 MED ORDER — FENTANYL CITRATE (PF) 100 MCG/2ML IJ SOLN: 100.0000 ug | INTRAMUSCULAR | Status: DC | PRN

## 2020-01-12 MED ORDER — PRENATAL MULTIVITAMIN CH
1.0000 | ORAL_TABLET | Freq: Every day | ORAL | Status: DC
  Administered 2020-01-12: 1 via ORAL
  Filled 2020-01-12 (×2): qty 1

## 2020-01-12 MED ORDER — LEVONORGESTREL 19.5 MCG/DAY IU IUD
INTRAUTERINE_SYSTEM | Freq: Once | INTRAUTERINE | Status: AC
  Filled 2020-01-12: qty 1

## 2020-01-12 MED ORDER — COCONUT OIL OIL: 1.0000 "application " | TOPICAL_OIL | Status: DC | PRN

## 2020-01-12 MED ORDER — DIBUCAINE (PERIANAL) 1 % EX OINT: 1.0000 "application " | TOPICAL_OINTMENT | CUTANEOUS | Status: DC | PRN

## 2020-01-12 MED ORDER — TERBUTALINE SULFATE 1 MG/ML IJ SOLN: 0.2500 mg | Freq: Once | INTRAMUSCULAR | Status: DC | PRN

## 2020-01-12 MED ORDER — PENICILLIN G POT IN DEXTROSE 60000 UNIT/ML IV SOLN
3.0000 10*6.[IU] | Freq: Once | INTRAVENOUS | Status: AC
  Administered 2020-01-12: 3 10*6.[IU] via INTRAVENOUS
  Filled 2020-01-12: qty 50

## 2020-01-12 MED ORDER — WITCH HAZEL-GLYCERIN EX PADS: 1.0000 "application " | MEDICATED_PAD | CUTANEOUS | Status: DC | PRN

## 2020-01-12 MED ORDER — OXYCODONE-ACETAMINOPHEN 5-325 MG PO TABS: 1.0000 | ORAL_TABLET | ORAL | Status: DC | PRN

## 2020-01-12 MED ORDER — SODIUM CHLORIDE 0.9 % IV SOLN: 250.0000 mL | INTRAVENOUS | Status: DC | PRN

## 2020-01-12 MED ORDER — ONDANSETRON HCL 4 MG/2ML IJ SOLN: 4.0000 mg | Freq: Four times a day (QID) | INTRAMUSCULAR | Status: DC | PRN

## 2020-01-12 MED ORDER — OXYTOCIN 40 UNITS IN NORMAL SALINE INFUSION - SIMPLE MED: 2.5000 [IU]/h | INTRAVENOUS | Status: DC

## 2020-01-12 MED ORDER — LIDOCAINE HCL (PF) 1 % IJ SOLN: 30.0000 mL | INTRAMUSCULAR | Status: DC | PRN

## 2020-01-12 MED ORDER — DIPHENHYDRAMINE HCL 25 MG PO CAPS: 25.0000 mg | ORAL_CAPSULE | Freq: Four times a day (QID) | ORAL | Status: DC | PRN

## 2020-01-12 NOTE — Anesthesia Postprocedure Evaluation (Signed)
Anesthesia Post Note  Patient: Ruth Gillespie  Procedure(s) Performed: AN AD HOC LABOR EPIDURAL     Patient location during evaluation: Mother Baby Anesthesia Type: Epidural Level of consciousness: awake and alert Pain management: pain level controlled Vital Signs Assessment: post-procedure vital signs reviewed and stable Respiratory status: spontaneous breathing, nonlabored ventilation and respiratory function stable Cardiovascular status: stable Postop Assessment: no headache, no backache and epidural receding Anesthetic complications: no    Last Vitals:  Vitals:   01/12/20 1007 01/12/20 1110  BP: 109/69 108/73  Pulse: 79 83  Resp: 16 16  Temp: 36.7 C 36.7 C  SpO2:      Last Pain:  Vitals:   01/12/20 1509  TempSrc:   PainSc: 0-No pain   Pain Goal:                Epidural/Spinal Function Cutaneous sensation: Normal sensation (01/12/20 1509), Patient able to flex knees: Yes (01/12/20 1509), Patient able to lift hips off bed: Yes (01/12/20 1509), Back pain beyond tenderness at insertion site: No (01/12/20 1509), Progressively worsening motor and/or sensory loss: No (01/12/20 1509), Bowel and/or bladder incontinence post epidural: No (01/12/20 1509)  [name]

## 2020-01-12 NOTE — Anesthesia Procedure Notes (Signed)
Epidural Patient location during procedure: OB Start time: 01/12/2020 4:25 AM End time: 01/12/2020 4:40 AM  Staffing Anesthesiologist: Elmer Picker, MD Performed: anesthesiologist   Preanesthetic Checklist Completed: patient identified, IV checked, risks and benefits discussed, monitors and equipment checked, pre-op evaluation and timeout performed  Epidural Patient position: sitting Prep: DuraPrep and site prepped and draped Patient monitoring: continuous pulse ox, blood pressure, heart rate and cardiac monitor Approach: midline Location: L3-L4 Injection technique: LOR air  Needle:  Needle type: Tuohy  Needle gauge: 17 G Needle length: 9 cm Needle insertion depth: 4 cm Catheter type: closed end flexible Catheter size: 19 Gauge Catheter at skin depth: 10 cm Test dose: negative  Assessment Sensory level: T8 Events: blood not aspirated, injection not painful, no injection resistance, no paresthesia and negative IV test  Additional Notes Patient identified. Risks/Benefits/Options discussed with patient including but not limited to bleeding, infection, nerve damage, paralysis, failed block, incomplete pain control, headache, blood pressure changes, nausea, vomiting, reactions to medication both or allergic, itching and postpartum back pain. Confirmed with bedside nurse the patient's most recent platelet count. Confirmed with patient that they are not currently taking any anticoagulation, have any bleeding history or any family history of bleeding disorders. Patient expressed understanding and wished to proceed. All questions were answered. Sterile technique was used throughout the entire procedure. Please see nursing notes for vital signs. Test dose was given through epidural catheter and negative prior to continuing to dose epidural or start infusion. Warning signs of high block given to the patient including shortness of breath, tingling/numbness in hands, complete motor block,  or any concerning symptoms with instructions to call for help. Patient was given instructions on fall risk and not to get out of bed. All questions and concerns addressed with instructions to call with any issues or inadequate analgesia.  Reason for block:procedure for pain

## 2020-01-12 NOTE — Progress Notes (Signed)
Labor Progress Note Ruth Gillespie is a 29 y.o. G3P2002 at [redacted]w[redacted]d presented for SOL. S: Continued strong ctx.   O:  BP 124/79   Pulse 93   Temp 98.1 F (36.7 C) (Oral)   Resp 16   Ht 5' (1.524 m)   Wt 67.8 kg   LMP 04/18/2019   SpO2 99%   BMI 29.18 kg/m  EFM: 135, moderate variability, pos accels, no decels, reactive TOCO: q2-73m  CVE: Dilation: 6 Effacement (%): 60 Cervical Position: Posterior Station: -2 Presentation: Vertex Exam by:: Dr Morene Antu    A&P: 30 y.o. [id number] [redacted]w[redacted]d here for SOL. #Labor: Progressing well. Presented with advanced dilation, painful ctx and need for GBS ppx. AROM with clear fluid at this exam. Pit PRN for augmentation. Anticipate VBAC (consent signed 12/21) #Pain: per patient request #FWB: Cat I #GBS positive; AMP>PCN  Joselyn Arrow, MD 2:38 AM

## 2020-01-12 NOTE — Discharge Instructions (Signed)

## 2020-01-12 NOTE — Lactation Note (Signed)
This note was copied from a baby's chart. Lactation Consultation Note  Patient Name: Ruth Gillespie [id number] Date: 01/12/2020 Reason for consult: Initial assessment;Early term 23-38.6wks  Baby is 6 hours old and sleeping upon arrival. Mother shared her intention to breastfeed but choosing formula due to lack of breast milk. DEBP already set up by nurse. Reviewed hand expression technique and showed proper colostrum collection in a bullet. Instructed mother to finger feed colostrum drops before supplementing with formula. Mother verbalized agreement.   Encouraged to contact for questions or assistance as needed.      Maternal Data Has patient been taught Hand Expression?: Yes Does the patient have breastfeeding experience prior to this delivery?: Yes  Feeding Feeding Type: Breast Fed  LATCH Score Latch: Too sleepy or reluctant, no latch achieved, no sucking elicited.  Audible Swallowing: None  Type of Nipple: Flat(semi flat)  Comfort (Breast/Nipple): Soft / non-tender  Hold (Positioning): No assistance needed to correctly position infant at breast.  LATCH Score: 5  Interventions Interventions: Breast massage;Hand express  Lactation Tools Discussed/Used Tools: Nipple Shields Nipple shield size: 20   Consult Status Consult Status: Follow-up Date: 01/13/20 Follow-up type: In-patient    Amarian Botero A Higuera Ancidey 01/12/2020, 3:06 PM

## 2020-01-12 NOTE — H&P (Signed)
OBSTETRIC ADMISSION HISTORY AND PHYSICAL  Ruth Gillespie is a 29 y.o. female G3P2002 with IUP at [redacted]w[redacted]d by LMP presenting for SOL with worsening ctx over last several hours. She reports +FMs, No LOF, no VB, no blurry vision, headaches or peripheral edema, and RUQ pain.  She plans on breast feeding. She requests IUD for birth control. She received her prenatal care at [facility]   Dating: By LMP --->  Estimated Date of Delivery: 01/23/20  Sono:    @[redacted]w[redacted]d , CWD, normal anatomy, cephalic presentation, anterior placental lie, 2790g, 29% EFW  Prenatal History/Complications: Hx of a c-section due to breech presentation followed by VBAC GBS pos Hx of anemia  Past Medical History: History reviewed. No pertinent past medical history.  Past Surgical History: Past Surgical History:  Procedure Laterality Date  . CESAREAN SECTION      Obstetrical History: OB History    Gravida  3   Para  2   Term  2   Preterm      AB      Living  2     SAB      TAB      Ectopic      Multiple      Live Births  2           Social History: Social History   Socioeconomic History  . Marital status: Married    Spouse name: Not on file  . Number of children: Not on file  . Years of education: Not on file  . Highest education level: Not on file  Occupational History  . Not on file  Tobacco Use  . Smoking status: Never Smoker  . Smokeless tobacco: Never Used  Substance and Sexual Activity  . Alcohol use: Not Currently  . Drug use: Never  . Sexual activity: Yes    Birth control/protection: None  Other Topics Concern  . Not on file  Social History Narrative  . Not on file   Social Determinants of Health   Financial Resource Strain:   . Difficulty of Paying Living Expenses:   Food Insecurity:   . Worried About 11-16-1994 in the Last Year:   . Programme researcher, broadcasting/film/video in the Last Year:   Transportation Needs:   . Barista (Medical):   Freight forwarder Lack of Transportation  (Non-Medical):   Physical Activity:   . Days of Exercise per Week:   . Minutes of Exercise per Session:   Stress:   . Feeling of Stress :   Social Connections:   . Frequency of Communication with Friends and Family:   . Frequency of Social Gatherings with Friends and Family:   . Attends Religious Services:   . Active Member of Clubs or Organizations:   . Attends [employer] Meetings:   Banker Marital Status:     Family History: History reviewed. No pertinent family history.  Allergies: No Known Allergies  Medications Prior to Admission  Medication Sig Dispense Refill Last Dose  . ferrous sulfate 325 (65 FE) MG EC tablet Take 325 mg by mouth 3 (three) times daily with meals.   01/11/2020 at Unknown time  . Prenatal Vit-Fe Fumarate-FA (PRENATAL MULTIVITAMIN) TABS tablet Take 1 tablet by mouth daily at 12 noon.   01/11/2020 at Unknown time     Review of Systems   All systems reviewed and negative except as stated in HPI  Blood pressure 126/74, pulse (!) 102, temperature 98 F (36.7 C), temperature source Oral,  resp. rate 18, height 5' (1.524 m), weight 67.8 kg, last menstrual period 04/18/2019, SpO2 99 %. General appearance: alert, cooperative and appears stated age Lungs: normal effort Heart: regular rate  Abdomen: soft, non-tender; bowel sounds normal Pelvic: gravid uterus Extremities: Homans sign is negative, no sign of DVT Presentation: cephalic Fetal monitoringBaseline: 130 bpm, Variability: Good {> 6 bpm), Accelerations: Reactive and Decelerations: Absent Uterine activity: Frequency: Every 4-5 minutes Dilation: 5 Effacement (%): 60 Station: -3, -2 Exam by:: Maryagnes Amos, RN   Prenatal labs: ABO, Rh: --/--/PENDING (03/29 2307) Antibody: PENDING (03/29 2307) Rubella:   RPR: NON-REACTIVE (01/18 0813)  HBsAg:    HIV: NON-REACTIVE (01/18 0813)  GBS:    2 hr Glucola WNL Genetic screening  WNL Anatomy US normal  Prenatal Transfer Tool  Maternal  Diabetes: No Genetic Screening: Normal Maternal Ultrasounds/Referrals: Normal Fetal Ultrasounds or other Referrals:  None Maternal Substance Abuse:  No Significant Maternal Medications:  None Significant Maternal Lab Results: Group B Strep positive  Results for orders placed or performed during the hospital encounter of 01/11/20 (from the past 24 hour(s))  CBC   Collection Time: 01/11/20 11:07 PM  Result Value Ref Range   WBC 14.2 (H) 4.0 - 10.5 K/uL   RBC 4.23 3.87 - 5.11 MIL/uL   Hemoglobin 12.4 12.0 - 15.0 g/dL   HCT 37.1 36.0 - 46.0 %   MCV 87.7 80.0 - 100.0 fL   MCH 29.3 26.0 - 34.0 pg   MCHC 33.4 30.0 - 36.0 g/dL   RDW 13.6 11.5 - 15.5 %   Platelets 254 150 - 400 K/uL   nRBC 0.0 0.0 - 0.2 %  Type and screen [place]   Collection Time: 01/11/20 11:07 PM  Result Value Ref Range   ABO/RH(D) PENDING    Antibody Screen PENDING    Sample Expiration      01/14/2020,2359 Performed at [facility], [place] [address], [place], [place] [address]     Patient Active Problem List   Diagnosis Date Noted  . Normal labor 01/11/2020  . Group B Streptococcus carrier, +RV culture, currently pregnant 01/02/2020  . History of successful VBAC 12/31/2019  . Supervision of other normal pregnancy, antepartum 09/08/2019  . History of cesarean delivery, currently pregnant 09/08/2019  . Hx of iron deficiency anemia 09/08/2019    Assessment/Plan:  Ruth Gillespie is a 29 y.o. G3P2002 at [redacted]w[redacted]d here for SOL.  #Labor: Vertex by RN exam. TOLAC. Hx of section for breech delivery followed by VBAC. Expectant management. AROM/Pit as needed when active. Anticipate VBAC. #Pain: Per patient request #FWB: Cat I; EFW: 3200g #ID:  GBS pos; Amp followed by PCN in four hours if able #MOF: breast #MOC: IUD; will consent and order #Circ:  Declines   Barrington Ellison, MD [facility] Family Medicine Fellow, [facility], [place]  [facility] 01/12/2020, 12:12 AM

## 2020-01-12 NOTE — Discharge Summary (Signed)
Postpartum Discharge Summary  Date of Service updated 01/13/20     Patient Name: Ruth Gillespie DOB: [birth year] MRN: [id number]  Date of admission: 01/11/2020 Delivering Provider: Emily Filbert   Date of discharge: 01/13/2020  Admitting diagnosis: Normal labor [O80, Z37.9] Intrauterine pregnancy: [redacted]w[redacted]d    Secondary diagnosis:  Active Problems:   History of cesarean delivery, currently pregnant   Hx of iron deficiency anemia   History of successful VBAC   Group B Streptococcus carrier, +RV culture, currently pregnant   Normal labor      Discharge diagnosis: VBAC                                                                                                Post partum procedures:none  Augmentation: Pitocin  Complications: None  Hospital course:  Onset of Labor With Vaginal Delivery     29y.o. yo G3P2002 at 38w3das admitted in Active Labor on 01/11/2020. Patient had an uncomplicated labor course as follows:  Membrane Rupture Time/Date: 2:34 AM ,01/12/2020   Intrapartum Procedures: Episiotomy: None [1]                                         Lacerations:  1st degree [2]  Patient had a delivery of a Viable infant. 01/12/2020  Information for the patient's newborn:  BrRowene, Suto0[[id number]]Delivery Method: Vag-Spont     Pateint had an uncomplicated postpartum course.  She is ambulating, tolerating a regular diet, passing flatus, and urinating well. Patient is discharged home in stable condition on 01/13/20.  Delivery time: 8:09 AM    Magnesium Sulfate received: No BMZ received: No Rhophylac:N/A MMR:N/A Transfusion:No  Physical exam  Vitals:   01/12/20 1509 01/12/20 1949 01/13/20 0000 01/13/20 0545  BP: 105/61 108/85 103/69 109/69  Pulse: 87 85 78 79  Resp: '16 18 19 18  ' Temp:  98 F (36.7 C) 97.7 F (36.5 C)   TempSrc:  Oral Oral   SpO2:  100% 98% 100%  Weight:      Height:       General: alert Lochia: appropriate Uterine  Fundus: firm DVT Evaluation: No evidence of DVT seen on physical exam. Labs: Lab Results  Component Value Date   WBC 14.2 (H) 01/11/2020   HGB 12.4 01/11/2020   HCT 37.1 01/11/2020   MCV 87.7 01/11/2020   PLT 254 01/11/2020   No flowsheet data found. Edinburgh Score: Edinburgh Postnatal Depression Scale Screening Tool 01/13/2020  I have been able to laugh and see the funny side of things. 0  I have looked forward with enjoyment to things. 0  I have blamed myself unnecessarily when things went wrong. 0  I have been anxious or worried for no good reason. 0  I have felt scared or panicky for no good reason. 0  Things have been getting on top of me. 0  I have been so unhappy that I have had difficulty sleeping. 0  I have felt sad or  miserable. 1  I have been so unhappy that I have been crying. 0  The thought of harming myself has occurred to me. 0  Edinburgh Postnatal Depression Scale Total 1    Discharge instruction: per After Visit Summary and "Baby and Me Booklet".  After visit meds:  Allergies as of 01/13/2020   No Known Allergies     Medication List    TAKE these medications   ferrous sulfate 325 (65 FE) MG EC tablet Take 325 mg by mouth 3 (three) times daily with meals.   ibuprofen 600 MG tablet Commonly known as: ADVIL Take 1 tablet (600 mg total) by mouth every 6 (six) hours.   prenatal multivitamin Tabs tablet Take 1 tablet by mouth daily at 12 noon.       Diet: routine diet  Activity: Advance as tolerated. Pelvic rest for 6 weeks.   Outpatient follow up:6 weeks Follow up Appt: Future Appointments  Date Time Provider [place]  02/15/2020  9:00 AM Anyanwu, Sallyanne Havers, MD [place] [facility]   Follow up Visit: [place] [facility] [place] [facility] [place]. Schedule an appointment as soon as possible for a visit in 5 week(s).   Specialty: Obstetrics and Gynecology Contact information: [place], [place] [phone]            Please schedule this patient for Postpartum visit in: 6 weeks with the following provider: Any provider In-Person Delivery mode:  SVD Anticipated Birth Control:  PP IUD Placed     Newborn Data: Live born female  Birth Weight:   APGAR: 9, 61  Newborn Delivery   Birth date/time: 01/12/2020 08:09:00 Delivery type: Vaginal, Spontaneous      Baby Feeding: Breast Disposition:home with mother   01/13/2020 Hansel Feinstein, CNM

## 2020-01-12 NOTE — Anesthesia Preprocedure Evaluation (Signed)

## 2020-01-13 MED ORDER — IBUPROFEN 600 MG PO TABS: 600.0000 mg | ORAL_TABLET | Freq: Four times a day (QID) | ORAL | 0 refills | Status: AC

## 2020-01-13 NOTE — Lactation Note (Signed)
This note was copied from a baby's chart. Lactation Consultation Note  Patient Name: Boy Vinessa Macconnell TDHRC'B Date: 01/13/2020 Reason for consult: Follow-up assessment;Early term 37-38.6wks;Infant weight loss;Other (Comment)(3 % weight loss)  Baby is 29 hours old and per mom last fed at 1230 10 ml .  LC asked mom how did the baby feed when latched , and mom mentioned  The baby is latching and falling asleep.  LC stressed the importance of STS feedings until the baby can stay awake for  Majority of feeding , back to birth weight and gaining steadily.  LC also recommended prior to latching moist war, cloth to the breast ,  Breast massage, hand express, prepump with the hand pump ( mom mentioned she has one at home ) and latch , when the baby is feeding breast compressions and stimulating the baby. Baby needs to latch and feed at the breast for at least 10 mins or greater if less than that its a snack not a feeding.  If the baby doesn't latch at the breast for at least 10 mins , the baby needs to be supplemented with 25 - 30 ml . By 48 hours of life baby should be up to 30 ml a feeding if the baby is not latching.  Per mom has a hand pump and a DEBP at home.  LC recommended adding post pumping after at least 4 feedings a day to increase the milk coming in.  Mom has the [facility] pamphlet with phone numbers.    Maternal Data    Feeding Feeding Type: (per mom baby last fed at 1230)  LATCH Score                   Interventions Interventions: Breast feeding basics reviewed  Lactation Tools Discussed/Used Pump Review: Milk Storage Initiated by:: MAI Date initiated:: 01/13/20   Consult Status Consult Status: Complete Date: 01/13/20    Kathrin Greathouse 01/13/2020, 1:46 PM

## 2020-01-19 ENCOUNTER — Encounter: Payer: Medicaid Other | Admitting: Advanced Practice Midwife

## 2020-02-15 ENCOUNTER — Ambulatory Visit (INDEPENDENT_AMBULATORY_CARE_PROVIDER_SITE_OTHER): Payer: Medicaid Other | Admitting: Obstetrics & Gynecology

## 2020-02-15 ENCOUNTER — Encounter: Payer: Self-pay | Admitting: Obstetrics & Gynecology

## 2020-02-15 ENCOUNTER — Other Ambulatory Visit: Payer: Self-pay

## 2020-02-15 ENCOUNTER — Other Ambulatory Visit (HOSPITAL_COMMUNITY)
Admission: RE | Admit: 2020-02-15 | Discharge: 2020-02-15 | Disposition: A | Discharge status: Home / Self Care | Payer: Medicaid Other | Source: Ambulatory Visit | Attending: Obstetrics & Gynecology | Admitting: Obstetrics & Gynecology

## 2020-02-15 DIAGNOSIS — Z30431 Encounter for routine checking of intrauterine contraceptive device: Secondary | ICD-10-CM

## 2020-02-15 NOTE — Progress Notes (Signed)
    Post Partum Visit Note  Ruth Gillespie is a 29 y.o. G25P3003 female who presents for a postpartum visit. She is 5 weeks postpartum following a normal spontaneous vaginal delivery.  I have fully reviewed the prenatal and intrapartum course. The delivery was at 38.3 gestational weeks, had immediate postplacental Liletta placed.  Anesthesia: epidural. Postpartum course has been unremarkable. Baby is doing well. Baby is feeding by breast. Bleeding staining only. Bowel function is normal. Bladder function is normal. Patient is not sexually active.  Postpartum depression screening: negative.  The following portions of the patient's history were reviewed and updated as appropriate: allergies, current medications, past family history, past medical history, past social history, past surgical history and problem list.  Review of Systems Pertinent items noted in HPI and remainder of comprehensive ROS otherwise negative.    Objective:  Height 5' (1.524 m), weight 137 lb (62.1 kg), last menstrual period 04/18/2019, currently breastfeeding.  General:  alert and no distress   Breasts:  inspection negative, no nipple discharge or bleeding, no masses or nodularity palpable  Lungs: clear to auscultation bilaterally  Heart:  regular rate and rhythm  Abdomen: soft, non-tender; bowel sounds normal; no masses,  no organomegaly   Vulva:  normal  Vagina: normal vagina, no discharge, exudate, lesion, or erythema. Well healed first degree laceration  Cervix:  no bleeding following Pap, no cervical motion tenderness and IUD strings seen, about 6 cm in length outside cervix.  Trimmed to 2 cm in length.  Corpus: normal size, contour, position, consistency, mobility, non-tender  Adnexa:  normal adnexa and no mass, fullness, tenderness  Rectal Exam: Not performed.        Assessment:    Normal postpartum exam. Pap smear done at today's visit.   Plan:   Essential components of care per ACOG  recommendations:  1.  Mood and well being: Patient with negative depression screening today. Reviewed local resources for support.  - Patient does not use tobacco. - History of drug use? No   2. Infant care and feeding:  -Patient currently breastmilk feeding? Yes. Reviewed importance of draining breast regularly to support lactation. -Social determinants of health (SDOH) reviewed in EPIC. No concerns.  3. Sexuality, contraception and birth spacing - Patient does not want a pregnancy in the next year.  Desired family size is 3 children.  - Discussed birth spacing of 18 months  4. Sleep and fatigue -Encouraged family/partner/community support of 4 hrs of uninterrupted sleep to help with mood and fatigue  5. Physical Recovery  - Discussed patients delivery - Patient had a 1st degree laceration, well healed. - Patient has urinary incontinence? No - Patient is safe to resume physical and sexual activity  6.  Health Maintenance - Pap smear done today. will follow up results and manage accordingly - Patient is considering getting COVID vaccine soon   Jaynie Collins, MD Center for [facility], [facility]

## 2020-02-15 NOTE — Patient Instructions (Signed)
Return to clinic for any scheduled appointments or for any gynecologic concerns as needed.   

## 2020-02-17 LAB — CYTOLOGY - PAP: Diagnosis: NEGATIVE

## 2021-06-14 IMAGING — US US MFM OB COMP +14 WKS
1 series · 14 of 28 positions shown · non-contrast
Comparison: none

[Series 1: us mfm ob comp +14 wks · 74 acquisitions, 14 frames shown]
[im 3/74]
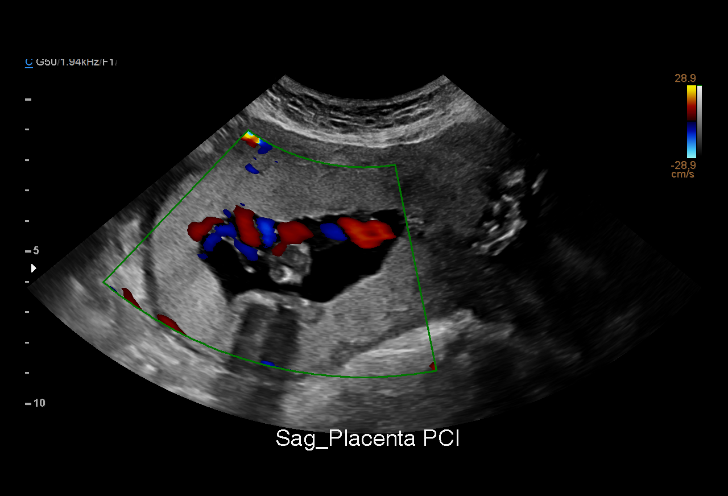
[im 9/74]
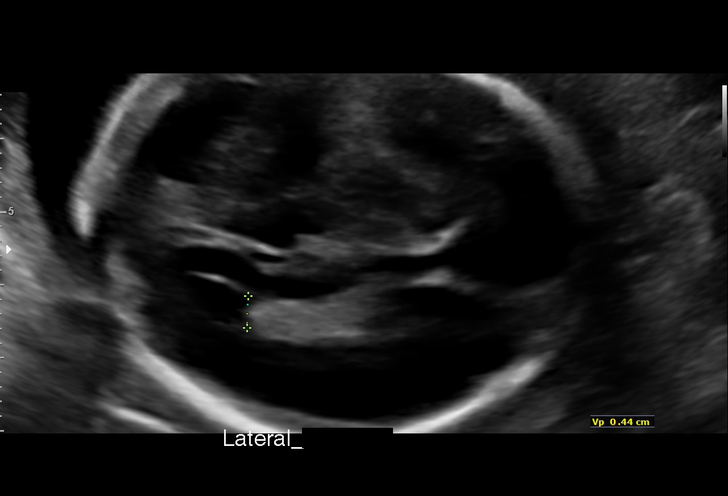
[im 14/74]
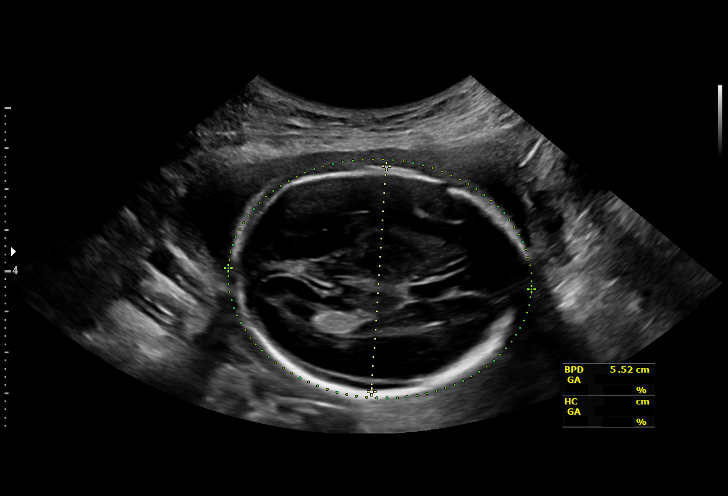
[im 19/74]
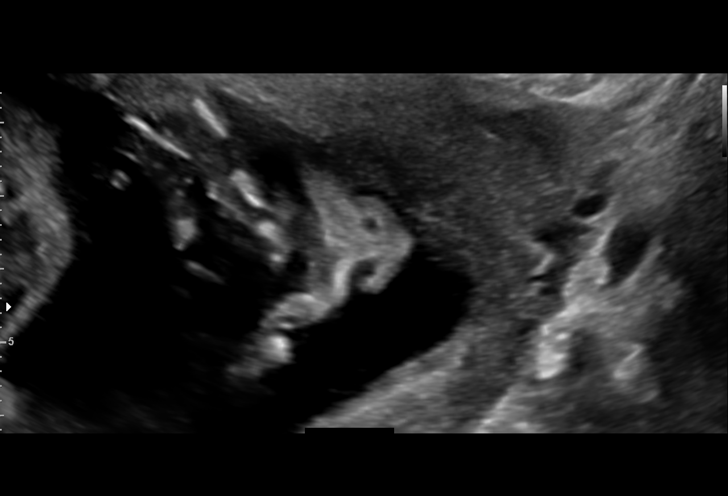
[im 25/74]
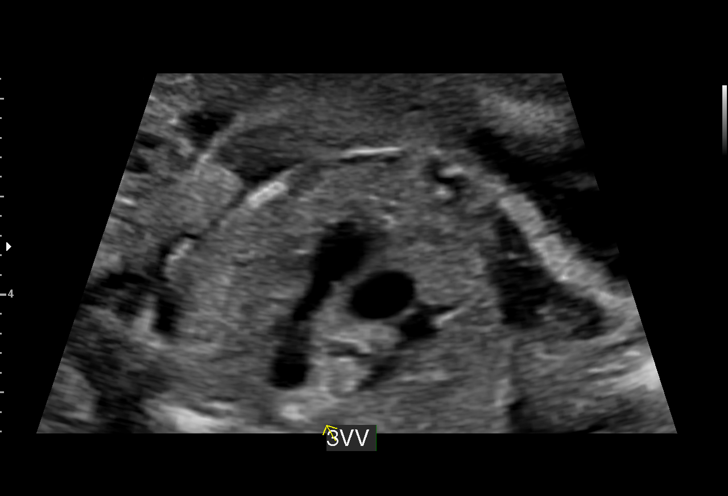
[im 30/74]
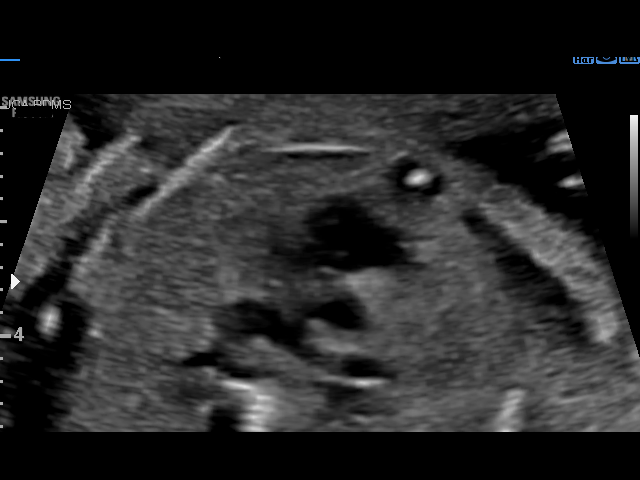
[im 36/74]
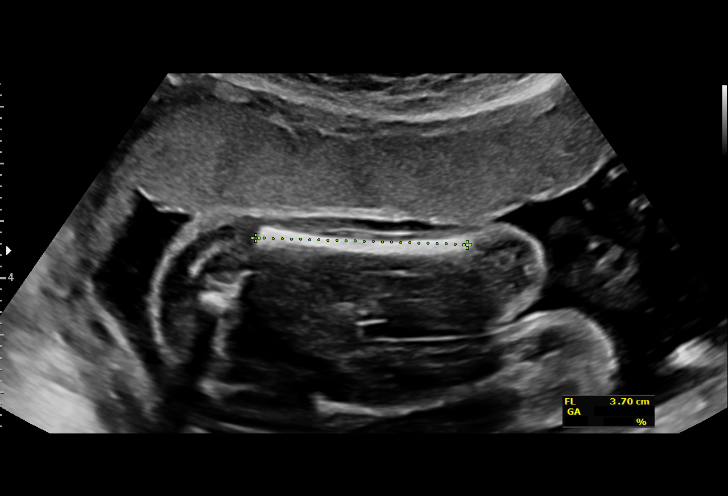
[im 41/74]
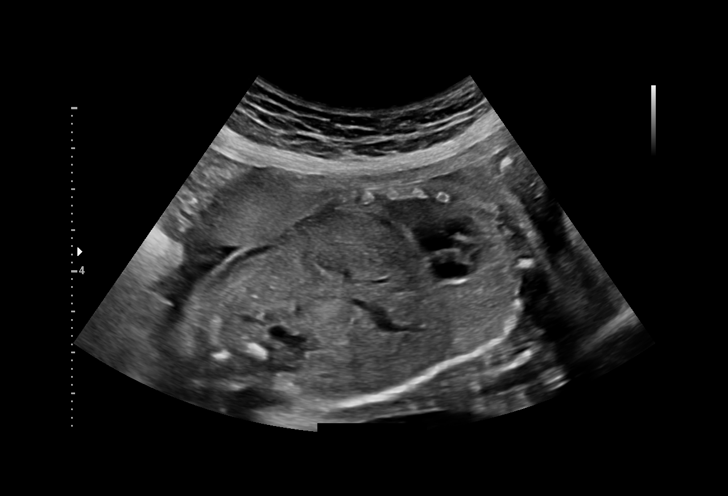
[im 46/74]
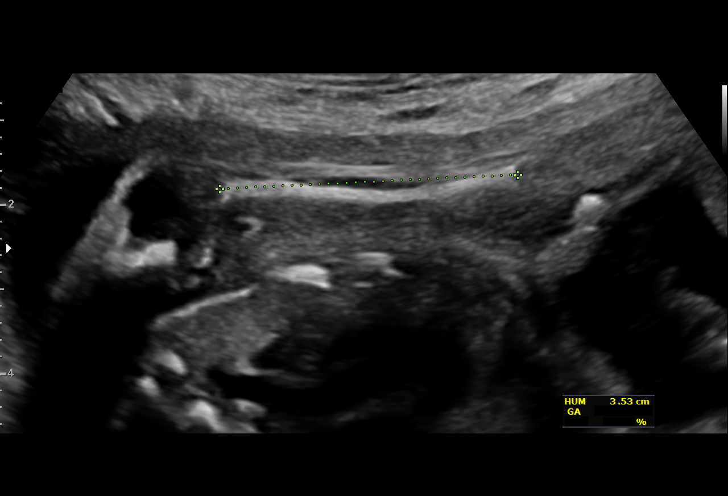
[im 52/74]
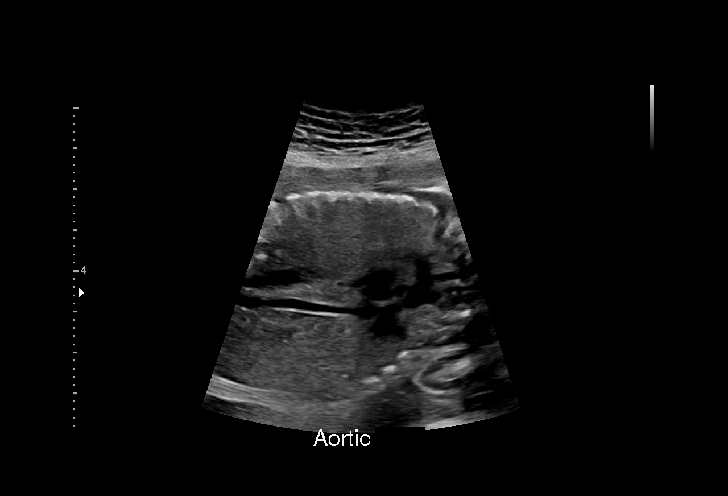
[im 57/74]
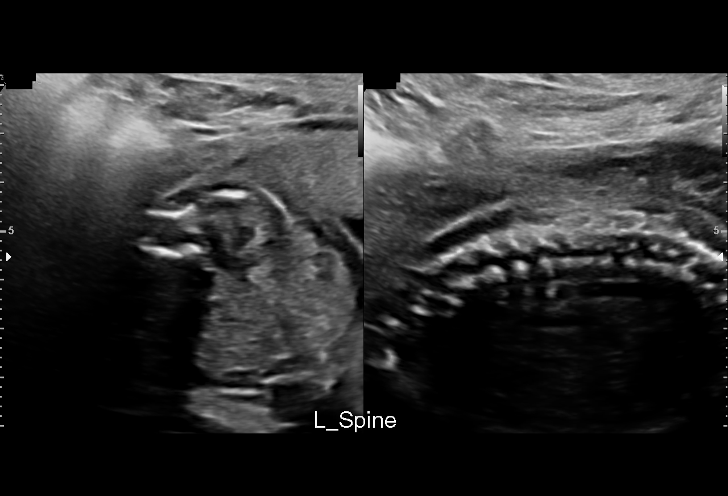
[im 63/74]
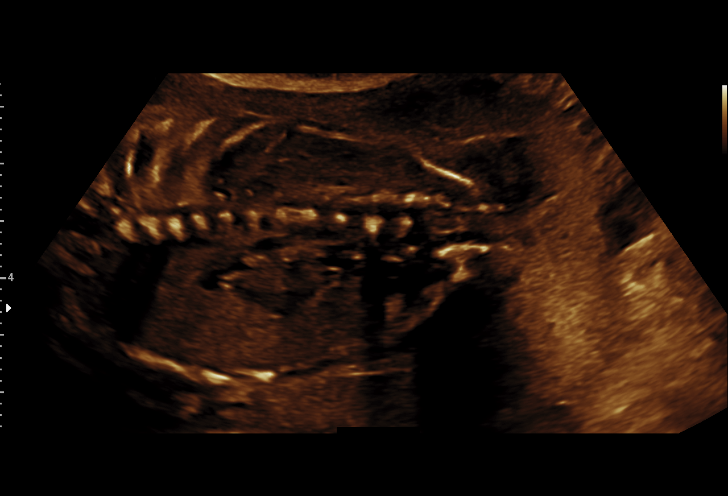
[im 68/74]
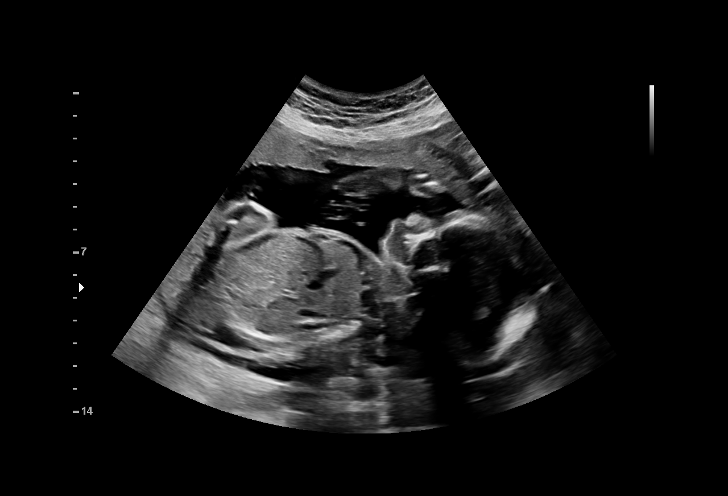
[im 74/74]
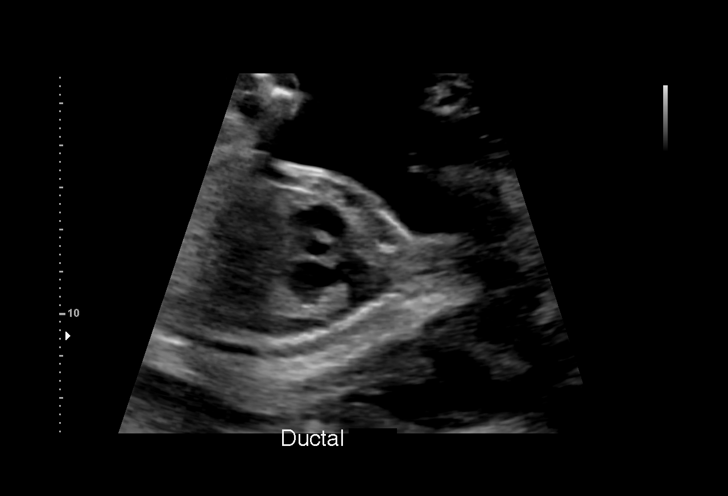

[14 of 28 positions shown; findings below may reference images not displayed]

GOUWA

                   QACAA CNM

  1  US MFM OB COMP + 14 WK               76805.01     BC JAROSLAV JACKO
 ----------------------------------------------------------------------

 ----------------------------------------------------------------------
Indications

  Encounter for antenatal screening for
  malformations (low risk NIPS and neg AFP)
  Previous cesarean delivery, antepartum
  22 weeks gestation of pregnancy
 ----------------------------------------------------------------------
Fetal Evaluation

 Num Of Fetuses:          1
 Fetal Heart Rate(bpm):   153
 Cardiac Activity:        Observed
 Presentation:            Cephalic
 Placenta:                Anterior Fundal
 P. Cord Insertion:       Visualized

 Amniotic Fluid
 AFI FV:      Within normal limits

                             Largest Pocket(cm)

Biometry

 BPD:        55  mm     G. Age:  22w 5d         54  %    CI:        70.63   %    70 - 86
                                                         FL/HC:       18.0  %    19.2 -
 HC:      208.6  mm     G. Age:  23w 0d         52  %    HC/AC:       1.15       1.05 -
 AC:       181   mm     G. Age:  23w 0d         54  %    FL/BPD:      68.4  %    71 - 87
 FL:       37.6  mm     G. Age:  22w 0d         22  %    FL/AC:       20.8  %    20 - 24
 HUM:      35.7  mm     G. Age:  22w 3d         40  %
 CER:      25.9  mm     G. Age:  23w 6d         70  %
 LV:        4.4  mm
 CM:        4.6  mm

 Est. FW:     516   gm     1 lb 2 oz     43  %
OB History

 Gravidity:    3         Term:   2
 Living:       2
Gestational Age

 LMP:           22w 4d        Date:  04/18/19                 EDD:   01/23/20
 U/S Today:     22w 5d                                        EDD:   01/22/20
 Best:          22w 4d     Det. By:  LMP  (04/18/19)          EDD:   01/23/20
Anatomy

 Cranium:               Appears normal         Aortic Arch:            Appears normal
 Cavum:                 Appears normal         Ductal Arch:            Not well visualized
 Ventricles:            Appears normal         Diaphragm:              Appears normal
 Choroid Plexus:        Appears normal         Stomach:                Appears normal, left
                                                                       sided
 Cerebellum:            Appears normal         Abdomen:                Appears normal
 Posterior Fossa:       Appears normal         Abdominal Wall:         Appears nml (cord
                                                                       insert, abd wall)
 Nuchal Fold:           Not applicable (>20    Cord Vessels:           Appears normal (3
                        wks GA)                                        vessel cord)
 Face:                  Appears normal         Kidneys:                Appear normal
                        (orbits and profile)
 Lips:                  Appears normal         Bladder:                Appears normal
 Thoracic:              Appears normal         Spine:                  Limited views
                                                                       appear normal
 Heart:                 Appears normal         Upper Extremities:      Appears normal
                        (4CH, axis, and
                        situs)
 RVOT:                  Not well visualized    Lower Extremities:      Appears normal
 LVOT:                  Appears normal
Impression

 Normal anatomy
 Good fetal movement and amniotic fllud
 Low risk NIPS and AFP
 Suboptimal views of the fetal anatomy obtained secondary to
 fetal position.
Recommendations

 Follow up growth in 4 weeks.

## 2024-11-05 ENCOUNTER — Ambulatory Visit: Admitting: Obstetrics and Gynecology

## 2024-11-05 ENCOUNTER — Encounter: Payer: Self-pay | Admitting: Obstetrics and Gynecology

## 2024-11-05 ENCOUNTER — Other Ambulatory Visit (HOSPITAL_COMMUNITY)
Admission: RE | Admit: 2024-11-05 | Discharge: 2024-11-05 | Disposition: A | Source: Ambulatory Visit | Attending: Obstetrics and Gynecology | Admitting: Obstetrics and Gynecology

## 2024-11-05 VITALS — BP 152/98 | HR 79 | Ht 60.0 in | Wt 153.0 lb

## 2024-11-05 DIAGNOSIS — Z124 Encounter for screening for malignant neoplasm of cervix: Secondary | ICD-10-CM | POA: Insufficient documentation

## 2024-11-05 DIAGNOSIS — R030 Elevated blood-pressure reading, without diagnosis of hypertension: Secondary | ICD-10-CM | POA: Diagnosis not present

## 2024-11-05 DIAGNOSIS — Z01419 Encounter for gynecological examination (general) (routine) without abnormal findings: Secondary | ICD-10-CM

## 2024-11-05 DIAGNOSIS — Z30432 Encounter for removal of intrauterine contraceptive device: Secondary | ICD-10-CM

## 2024-11-05 NOTE — Progress Notes (Signed)
 "  ANNUAL GYNECOLOGY VISIT Chief Complaint  Patient presents with   iud removal     Subjective:  Ruth Gillespie is a 34 y.o. [id number] who presents for IUD removal, due for annual.  Has Liletta  IUD since 2021. Requests removal. Husband has vasectomy scheduled.   Gyn History: Patient's last menstrual period was 11/03/2024 (exact date). Sexually active: yes/no: Yes Contraception: IUD History of STIs: No Last pap:  Lab Results  Component Value Date   DIAGPAP  02/15/2020    - Negative for intraepithelial lesion or malignancy (NILM)  History of abnormal pap: no Periods: regular    The pregnancy intention screening data noted above was reviewed. Potential methods of contraception were discussed. The patient elected to proceed with No data recorded.        No data to display               No data to display            OB History     Gravida  3   Para  3   Term  3   Preterm      AB      Living  3      SAB      IAB      Ectopic      Multiple  0   Live Births  3           Past Medical History:  Diagnosis Date   Medical history non-contributory     Past Surgical History:  Procedure Laterality Date   CESAREAN SECTION      Social History   Socioeconomic History   Marital status: Married    Spouse name: Abryanna Musolino   Number of children: 3   Years of education: Not on file   Highest education level: Some college, no degree  Occupational History   Not on file  Tobacco Use   Smoking status: Never   Smokeless tobacco: Never  Vaping Use   Vaping status: Never Used  Substance and Sexual Activity   Alcohol use: Never   Drug use: Never   Sexual activity: Yes    Birth control/protection: I.U.D.  Other Topics Concern   Not on file  Social History Narrative   Not on file   Social Drivers of Health   Tobacco Use: Low Risk (11/05/2024)   Patient History    Smoking Tobacco Use: Never    Smokeless Tobacco Use: Never     Passive Exposure: Not on file  Financial Resource Strain: Low Risk (09/25/2024)   Received from [facility]   Overall Financial Resource Strain (CARDIA)    How hard is it for you to pay for the very basics like food, housing, medical care, and heating?: Not hard at all  Food Insecurity: No Food Insecurity (09/25/2024)   Received from [facility]   Epic    Within the past 12 months, you worried that your food would run out before you got the money to buy more.: Never true    Within the past 12 months, the food you bought just didn't last and you didn't have money to get more.: Never true  Transportation Needs: No Transportation Needs (09/25/2024)   Received from [facility]    In the past 12 months, has lack of transportation kept you from medical appointments or from getting medications?: No    In the past 12 months, has lack of transportation kept you from  meetings, work, or from getting things needed for daily living?: No  Physical Activity: Insufficiently Active (09/25/2024)   Received from [facility]   Exercise Vital Sign    On average, how many days per week do you engage in moderate to strenuous exercise (like a brisk walk)?: 5 days    On average, how many minutes do you engage in exercise at this level?: 20 min  Stress: No Stress Concern Present (09/25/2024)   Received from [facility] - Occupational Stress Questionnaire    Do you feel stress - tense, restless, nervous, or anxious, or unable to sleep at night because your mind is troubled all the time - these days?: Not at all  Social Connections: Socially Integrated (09/25/2024)   Received from [facility]   Social Network    How would you rate your social network (family, work, friends)?: Good participation with social networks  Depression (PHQ2-9): Not on file  Alcohol Screen: Not on file  Housing: Low Risk (09/25/2024)   [facility]     In the last 12 months, was there a time when you were not able to pay the mortgage or rent on time?: No    In the past 12 months, how many times have you moved where you were living?: 1    At any time in the past 12 months, were you homeless or living in a shelter (including now)?: No  Utilities: Not At Risk (09/25/2024)   Received from [facility]    In the past 12 months has the electric, gas, oil, or water company threatened to shut off services in your home?: No  Health Literacy: Not on file    Family History  Problem Relation Age of Onset   Diabetes Father    Diabetes Paternal Uncle    Diabetes Paternal Grandmother     Medications Ordered Prior to Encounter[1]  Allergies[2]   Objective:   Vitals:   11/05/24 0953  BP: (!) 152/98  Pulse: 79  Weight: 153 lb (69.4 kg)  Height: 5' (1.524 m)   Physical Examination:   General appearance - well appearing, and in no distress  Mental status - alert, oriented to person, place, and time  Psych:  normal mood and affect  Skin - warm and dry, normal color, no suspicious lesions noted  Breasts - breasts appear normal, no suspicious masses, no skin or nipple changes or  axillary nodes  Abdomen - soft, nontender, nondistended, no masses or organomegaly  Pelvic -  VULVA: normal appearing vulva with no masses, tenderness or lesions   VAGINA: normal appearing vagina with normal color and discharge, no lesions   CERVIX: normal appearing cervix without discharge or lesions, IUD strings seen  Thin prep pap is done with HR HPV cotesting  UTERUS: uterus is felt to be normal size, shape, consistency and nontender   ADNEXA: No adnexal masses or tenderness noted.  Extremities:  No swelling or varicosities noted  Chaperone present for exam   IUD Removal  Patient identified, informed consent performed, consent signed.  Patient was in the dorsal lithotomy position, normal external genitalia was noted.  A speculum was placed in the  patient's vagina, normal discharge was noted, no lesions. The cervix was visualized, no lesions, no abnormal discharge.  The strings of the IUD were grasped and pulled using ring forceps. The IUD was removed in its entirety.  Assessment and Plan:  1. Well woman exam with routine gynecological exam (Primary) Pap/HPV Normal clinical breast exam, reviewed self breast exams, mammograms to start at age 51 Declines STI testing Vasectomy planned  2. Cervical cancer screening - Cytology - PAP( [facility])  3. Encounter for IUD removal Uncomplicated removal  4. Elevated blood pressure reading PCP f/u   No follow-ups on file.  No future appointments.   Rollo ONEIDA Bring, MD, FACOG Obstetrician & Gynecologist, [facility] for [facility], [facility]    [1]  Current Outpatient Medications on File Prior to Visit  Medication Sig Dispense Refill   ibuprofen  (ADVIL ) 600 MG tablet Take 1 tablet (600 mg total) by mouth every 6 (six) hours. (Patient taking differently: Take 600 mg by mouth as needed.) 30 tablet 0   levonorgestrel  (LILETTA , 52 MG,) 19.5 MCG/DAY IUD IUD 1 each by Intrauterine route once.     ferrous sulfate 325 (65 FE) MG EC tablet Take 325 mg by mouth 3 (three) times daily with meals. (Patient not taking: Reported on 11/05/2024)     Prenatal Vit-Fe Fumarate-FA (PRENATAL MULTIVITAMIN) TABS tablet Take 1 tablet by mouth daily at 12 noon. (Patient not taking: Reported on 11/05/2024)     No current facility-administered medications on file prior to visit.  [2] No Known Allergies  "

## 2024-11-06 LAB — CYTOLOGY - PAP
Comment: NEGATIVE
Diagnosis: NEGATIVE
High risk HPV: NEGATIVE

## 2024-11-09 ENCOUNTER — Ambulatory Visit: Payer: Self-pay | Admitting: Obstetrics and Gynecology
